# Patient Record
Sex: Female | Born: 1955 | Race: Asian | Hispanic: No | Marital: Married | Smoking: Never smoker
Health system: Southern US, Community
[De-identification: ages and names within clinical notes are randomized; demographics above are authoritative.]

## PROBLEM LIST (undated history)

## (undated) DIAGNOSIS — M1990 Unspecified osteoarthritis, unspecified site: Secondary | ICD-10-CM

## (undated) DIAGNOSIS — E785 Hyperlipidemia, unspecified: Secondary | ICD-10-CM

## (undated) DIAGNOSIS — R42 Dizziness and giddiness: Secondary | ICD-10-CM

## (undated) HISTORY — DX: Hyperlipidemia, unspecified: E78.5

## (undated) HISTORY — DX: Dizziness and giddiness: R42

## (undated) HISTORY — DX: Unspecified osteoarthritis, unspecified site: M19.90

---

## 2014-09-12 ENCOUNTER — Ambulatory Visit: Payer: Self-pay | Attending: Family Medicine

## 2014-12-31 ENCOUNTER — Emergency Department (INDEPENDENT_AMBULATORY_CARE_PROVIDER_SITE_OTHER): Payer: Self-pay

## 2014-12-31 ENCOUNTER — Emergency Department (INDEPENDENT_AMBULATORY_CARE_PROVIDER_SITE_OTHER)
Admission: EM | Admit: 2014-12-31 | Discharge: 2014-12-31 | Disposition: A | Discharge status: Home / Self Care | Payer: Self-pay | Source: Home / Self Care | Attending: Family Medicine | Admitting: Family Medicine

## 2014-12-31 ENCOUNTER — Encounter: Payer: Self-pay | Admitting: Emergency Medicine

## 2014-12-31 DIAGNOSIS — M79645 Pain in left finger(s): Secondary | ICD-10-CM

## 2014-12-31 DIAGNOSIS — M6530 Trigger finger, unspecified finger: Secondary | ICD-10-CM

## 2014-12-31 DIAGNOSIS — M654 Radial styloid tenosynovitis [de Quervain]: Secondary | ICD-10-CM

## 2014-12-31 MED ORDER — DICLOFENAC SODIUM 1 % TD GEL
1.0000 | Freq: Three times a day (TID) | TRANSDERMAL | Status: DC
Start: 2014-12-31 — End: 2015-02-16

## 2014-12-31 NOTE — ED Notes (Signed)
Left thumb pain x 2 weeks worse yesterday 8/10

## 2014-12-31 NOTE — ED Provider Notes (Signed)
CSN: [id number]     Arrival date & time 12/31/14  1315 History   First MD Initiated Contact with Patient 12/31/14 1327     Chief Complaint  Patient presents with  . Hand Pain      HPI Comments: Patient is [place], and her son is present as Engineer, technical sales. She complains of pain in her left thumb for two weeks.  She is left-handed and is having difficulty with her house work.  She denies injury.  She complains that her left thumb IP joint is stiff, painful, and clicks with movement.  Patient is a 59 y.o. female presenting with hand pain. The history is provided by the patient and a relative. The history is limited by a language barrier.  Hand Pain This is a new problem. Episode onset: 2 weeks ago. The problem occurs constantly. The problem has been gradually worsening. Exacerbated by: movement of left thumb. Nothing relieves the symptoms. She has tried nothing for the symptoms.    History reviewed. No pertinent past medical history. History reviewed. No pertinent past surgical history. No family history on file. Social History  Substance Use Topics  . Smoking status: Never Smoker   . Smokeless tobacco: None  . Alcohol Use: No   OB History    No data available     Review of Systems  All other systems reviewed and are negative.   Allergies  Review of patient's allergies indicates no known allergies.  Home Medications   Prior to Admission medications   Medication Sig Start Date End Date Taking? Authorizing Provider  ibuprofen (ADVIL,MOTRIN) 800 MG tablet Take 800 mg by mouth every 8 (eight) hours as needed.   Yes Historical Provider, MD  diclofenac sodium (VOLTAREN) 1 % GEL Apply 1 application topically 3 (three) times daily. Apply 1 or 2 gm each application 12/31/14   Lattie Haw, MD   BP 144/78 mmHg  Pulse 56  Temp(Src) 98.6 F (37 C) (Oral)  Ht  (1.549 m)  Wt 130 lb (58.968 kg)  BMI 24.58 kg/m2  SpO2 99% Physical Exam  Constitutional: She appears well-developed  and well-nourished.  HENT:  Head: Normocephalic.  Eyes: Pupils are equal, round, and reactive to light.  Pulmonary/Chest: No respiratory distress.  Musculoskeletal:       Left hand: She exhibits decreased range of motion, tenderness and bony tenderness. She exhibits no deformity and no swelling.       Hands: Left thumb has decreased range of motion at the IP joint.  There is tenderness to palpation over the IP joint, and also the thumb extensor tendons, although the MCP joint has good range of motion.  Patient has difficulty actively flexing her left thumb IP joint, and it returns to neutral position with a painful click.  Neurological: She is alert.  Skin: Skin is warm and dry.  Nursing note and vitals reviewed.   ED Course  Procedures  none  Imaging Review Dg Finger Thumb Left  12/31/2014   CLINICAL DATA:  59 year old female with a history of left thumb pain for 2 weeks. No history of injury.  EXAM: LEFT THUMB 2+V  COMPARISON:  None.  FINDINGS: No displaced fracture. No radiopaque foreign body. No significant soft tissue swelling. Degenerative changes of the interphalangeal joint, and of the first metacarpal phalangeal joint.  IMPRESSION: Negative for acute bony abnormality. Degenerative changes of the interphalangeal joint and first metacarpal phalangeal joint.  Signed,  Yvone Neu. Loreta Ave DO  [facility]  [facility]   Electronically Signed   By: Gilmer Mor D.O.   On: 12/31/2014 14:04     MDM   1. Trigger finger, acquired   2. De Quervain's tenosynovitis, left    Begin Voltaren gel.  Dispensed thumb spica splint. Wear wrist brace for about 10 to 14 days.  Apply ice pack for 10 to 15 minutes, 3 to 4 times daily  Continue until pain decreases.  Begin thumb range of motion and stretching exercises. Followup with Dr. Rodney Langton or Dr. Clementeen [facility] ([facility]) if not improving about two to three weeks.     Lattie Haw, MD 01/01/15 2002

## 2014-12-31 NOTE — Discharge Instructions (Signed)
Wear wrist brace for about 10 to 14 days.  Apply ice pack for 10 to 15 minutes, 3 to 4 times daily  Continue until pain decreases.  Begin thumb range of motion and stretching exercises.   Trigger Finger Trigger finger (digital tendinitis and stenosing tenosynovitis) is a common disorder that causes an often painful catching of the fingers or thumb. It occurs as a clicking, snapping, or locking of a finger in the palm of the hand. This is caused by a problem with the tendons that flex or bend the fingers sliding smoothly through their sheaths. The condition may occur in any finger or a couple fingers at the same time.  The finger may lock with the finger curled or suddenly straighten out with a snap. This is more common in patients with rheumatoid arthritis and diabetes. Left untreated, the condition may get worse to the point where the finger becomes locked in flexion, like making a fist, or less commonly locked with the finger straightened out. CAUSES   Inflammation and scarring that lead to swelling around the tendon sheath.  Repeated or forceful movements.  Rheumatoid arthritis, an autoimmune disease that affects joints.  Gout.  Diabetes mellitus. SIGNS AND SYMPTOMS  Soreness and swelling of your finger.  A painful clicking or snapping as you bend and straighten your finger. DIAGNOSIS  Your health care provider will do a physical exam of your finger to diagnose trigger finger. TREATMENT   Splinting for 6-8 weeks may be helpful.  Nonsteroidal anti-inflammatory medicines (NSAIDs) can help to relieve the pain and inflammation.  Cortisone injections, along with splinting, may speed up recovery. Several injections may be required. Cortisone may give relief after one injection.  Surgery is another treatment that may be used if conservative treatments do not work. Surgery can be minor, without incisions (a cut does not have to be made), and can be done with a needle through the  skin.  Other surgical choices involve an open procedure in which the surgeon opens the hand through a small incision and cuts the pulley so the tendon can again slide smoothly. Your hand will still work fine. HOME CARE INSTRUCTIONS  Apply ice to the injured area, twice per day:  Put ice in a plastic bag.  Place a towel between your skin and the bag.  Leave the ice on for 20 minutes, 3-4 times a day.  Rest your hand often. MAKE SURE YOU:   Understand these instructions.  Will watch your condition.  Will get help right away if you are not doing well or get worse. Document Released: 02/17/2004 Document Revised: 12/30/2012 Document Reviewed: 09/29/2012 [facility] Patient Information 2015 [place], [place]. This information is not intended to replace advice given to you by your health care provider. Make sure you discuss any questions you have with your health care provider.   De Quervain's Disease Erin Nielsen disease is a condition often seen in racquet sports where there is a soreness (inflammation) in the cord like structures (tendons) which attach muscle to bone on the thumb side of the wrist. There may be a tightening of the tissuesaround the tendons. This condition is often helped by giving up or modifying the activity which caused it. When conservative treatment does not help, surgery may be required. Conservative treatment could include changes in the activity which brought about the problem or made it worse. Anti-inflammatory medications and injections may be used to help decrease the inflammation and help with pain control. Your caregiver will help you  determine which is best for you. DIAGNOSIS  Often the diagnosis (learning what is wrong) can be made by examination. Sometimes x-rays are required. HOME CARE INSTRUCTIONS   Apply ice to the sore area for 15-20 minutes, 03-04 times per day while awake. Put the ice in a plastic bag and place a towel between the bag of ice and your skin.  This is especially helpful if it can be done after all activities involving the sore wrist.  Temporary splinting may help.  Only take over-the-counter or prescription medicines for pain, discomfort or fever as directed by your caregiver. SEEK MEDICAL CARE IF:   Pain relief is not obtained with medications, or if you have increasing pain and seem to be getting worse rather than better. MAKE SURE YOU:   Understand these instructions.  Will watch your condition.  Will get help right away if you are not doing well or get worse. Document Released: 01/22/2001 Document Revised: 07/22/2011 Document Reviewed: 09/01/2013 [facility] Patient Information 2015 [place], [place]. This information is not intended to replace advice given to you by your health care provider. Make sure you discuss any questions you have with your health care provider.

## 2015-01-09 ENCOUNTER — Encounter: Payer: Self-pay | Admitting: Family Medicine

## 2015-01-09 ENCOUNTER — Ambulatory Visit (INDEPENDENT_AMBULATORY_CARE_PROVIDER_SITE_OTHER): Payer: No Typology Code available for payment source | Admitting: Family Medicine

## 2015-01-09 ENCOUNTER — Other Ambulatory Visit: Payer: Self-pay | Admitting: Family Medicine

## 2015-01-09 VITALS — BP 126/70 | HR 52 | Temp 98.2°F | Resp 18 | Ht 62.0 in | Wt 128.0 lb

## 2015-01-09 DIAGNOSIS — Z0000 Encounter for general adult medical examination without abnormal findings: Secondary | ICD-10-CM | POA: Insufficient documentation

## 2015-01-09 DIAGNOSIS — Z7189 Other specified counseling: Secondary | ICD-10-CM

## 2015-01-09 DIAGNOSIS — H8110 Benign paroxysmal vertigo, unspecified ear: Secondary | ICD-10-CM | POA: Insufficient documentation

## 2015-01-09 DIAGNOSIS — Z7689 Persons encountering health services in other specified circumstances: Secondary | ICD-10-CM

## 2015-01-09 LAB — CBC WITH DIFFERENTIAL/PLATELET
BASOS ABS: 0.1 10*3/uL (ref 0.0–0.1)
Basophils Relative: 1 % (ref 0–1)
EOS ABS: 0.7 10*3/uL (ref 0.0–0.7)
Eosinophils Relative: 11 % — ABNORMAL HIGH (ref 0–5)
HCT: 38.8 % (ref 36.0–46.0)
HEMOGLOBIN: 12.5 g/dL (ref 12.0–15.0)
LYMPHS ABS: 3.1 10*3/uL (ref 0.7–4.0)
LYMPHS PCT: 51 % — AB (ref 12–46)
MCH: 28.9 pg (ref 26.0–34.0)
MCHC: 32.2 g/dL (ref 30.0–36.0)
MCV: 89.8 fL (ref 78.0–100.0)
MPV: 8.2 fL — ABNORMAL LOW (ref 8.6–12.4)
Monocytes Absolute: 0.4 10*3/uL (ref 0.1–1.0)
Monocytes Relative: 6 % (ref 3–12)
NEUTROS ABS: 1.9 10*3/uL (ref 1.7–7.7)
Neutrophils Relative %: 31 % — ABNORMAL LOW (ref 43–77)
PLATELETS: 261 10*3/uL (ref 150–400)
RBC: 4.32 MIL/uL (ref 3.87–5.11)
RDW: 13.1 % (ref 11.5–15.5)
WBC: 6.1 10*3/uL (ref 4.0–10.5)

## 2015-01-09 MED ORDER — MECLIZINE HCL 50 MG PO TABS: 25.0000 mg | ORAL_TABLET | Freq: Three times a day (TID) | ORAL | Status: DC | PRN

## 2015-01-09 MED ORDER — OXYBUTYNIN CHLORIDE ER 10 MG PO TB24: 10.0000 mg | ORAL_TABLET | Freq: Every day | ORAL | Status: DC

## 2015-01-09 NOTE — Patient Instructions (Addendum)
I am prescribing a medication for dizziness and for urinary frequency to [facility] Come back in one month for PAP smear and breast exam.

## 2015-01-09 NOTE — Progress Notes (Signed)
Patient ID: Erin Nielsen, female   DOB: [birth year], 59 y.o.   MRN: [id number]   Erin Nielsen, is a 59 y.o. female  WUJ:[id number]  [id number]:[id number]  DOB - 24-Jul-1955  CC:  Chief Complaint  Patient presents with  . establish new patient    dizziness with nausea, labs, stomach ache at night       HPI: Erin Nielsen is a 59 y.o. female here to establish care. Her medical history is positive for arthritis of shoulders and knees. She takes naproxen for this. She has a history of hypertension, Vertigo and occassional  Sharp abdominal pain. She has used a [place] prescription for the vertigo but I am unsure that it is. I suspect something like meclizine. She takes OTC prilosec occassional for epigastric discomfort. Information is obtain with the use of an interpreter.  She denies every having a PAP smear or a mammogram. She does also complate of urinary frequency sometimes 4-5 times a night. She would like something to help with that.   No Known Allergies Past Medical History  Diagnosis Date  . Arthritis   . Hyperlipidemia    Current Outpatient Prescriptions on File Prior to Visit  Medication Sig Dispense Refill  . diclofenac sodium (VOLTAREN) 1 % GEL Apply 1 application topically 3 (three) times daily. Apply 1 or 2 gm each application 100 g 1  . ibuprofen (ADVIL,MOTRIN) 800 MG tablet Take 800 mg by mouth every 8 (eight) hours as needed.     No current facility-administered medications on file prior to visit.   History reviewed. No pertinent family history. Social History   Social History  . Marital Status: Married    Spouse Name: N/A  . Number of Children: N/A  . Years of Education: N/A   Occupational History  . Not on file.   Social History Main Topics  . Smoking status: Never Smoker   . Smokeless tobacco: Never Used  . Alcohol Use: No  . Drug Use: Not on file  . Sexual Activity: Not on file   Other Topics Concern  . Not on file   Social History Narrative    Review of  Systems: Constitutional: Negative for fever, chills, appetite change, weight loss,  Fatigue. Skin: Negative for rashes or lesions of concern. There is a bruise on her left shoulder from Chinese Cupping HENT: Negative for ear pain, ear discharge.nose bleeds. Positive for intermittent ear noises. Eyes: Negative for pain, discharge, redness, itching and visual disturbance. Neck: Negative for pain, stiffness Respiratory: Negative for cough, shortness of breath,   Cardiovascular: Negative for chest pain, palpitations and leg swelling. Gastrointestinal: Negative for nausea, vomiting, diarrhea, constipations. Positive for occassional sharp upper abd pain.  Genitourinary: Negative for dysuria, urgency, hematuria. Positive for frequency Musculoskeletal: Negative for joint  swelling, and gait problem.Negative for weakness.Positive for back and knee pain. Neurological: Negative for tremors, seizures, syncope,   light-headedness, numbness and headaches. Positive for dizziness that is described as room spinning with nausea. About every 2-3 months. Hematological: Negative for easy bruising or bleeding Psychiatric/Behavioral: Negative for depression, anxiety, decreased concentration, confusion   Objective:   Filed Vitals:   01/09/15 0929  BP: 126/70  Pulse: 52  Temp: 98.2 F (36.8 C)  Resp: 18    Physical Exam: Constitutional: Patient appears well-developed and well-nourished. No distress. HENT: Normocephalic, atraumatic, External right and left ear normal. Oropharynx is clear and moist.  Eyes: Conjunctivae and EOM are normal. PERRLA, no scleral icterus. Neck: Normal ROM. Neck supple. No lymphadenopathy, No  thyromegaly. CVS: RRR, S1/S2 +, no murmurs, no gallops, no rubs Pulmonary: Effort and breath sounds normal, no stridor, rhonchi, wheezes, rales.  Abdominal: Soft. Normoactive BS,, no distension, tenderness, rebound or guarding.  Musculoskeletal: Normal range of motion. No edema and no  tenderness.  Neuro: Alert.Normal muscle tone coordination. Non-focal Skin: Skin is warm and dry. No rash noted. Not diaphoretic. No erythema. No pallor. There is a round bruise on her left shoulder from Chinese cupping. Psychiatric: Normal mood and affect. Behavior, judgment, thought content normal.  No results found for: WBC, HGB, HCT, MCV, PLT No results found for: CREATININE, BUN, NA, K, CL, CO2  No results found for: HGBA1C Lipid Panel  No results found for: CHOL, TRIG, HDL, CHOLHDL, VLDL, LDLCALC     Assessment and plan:   1. Encounter to establish care  - COMPLETE METABOLIC PANEL WITH GFR - CBC with Differential - Lipid panel   No Follow-up on file.  The patient was given clear instructions to go to ER or return to medical center if symptoms don't improve, worsen or new problems develop. The patient verbalized understanding.    AVIA MERKLEY FNP  01/09/2015, 12:23 PM

## 2015-01-10 LAB — LIPID PANEL
CHOLESTEROL: 191 mg/dL (ref 125–200)
HDL: 51 mg/dL (ref >=46)
LDL Cholesterol: 117 mg/dL (ref <130)
Total CHOL/HDL Ratio: 3.7 Ratio (ref <=5.0)
Triglycerides: 115 mg/dL (ref <150)
VLDL: 23 mg/dL (ref <30)

## 2015-01-10 LAB — COMPREHENSIVE METABOLIC PANEL
ALK PHOS: 42 U/L (ref 33–130)
ALT: 25 U/L (ref 6–29)
AST: 28 U/L (ref 10–35)
Albumin: 4.3 g/dL (ref 3.6–5.1)
BUN: 10 mg/dL (ref 7–25)
CO2: 24 mmol/L (ref 20–31)
CREATININE: 0.67 mg/dL (ref 0.50–1.05)
Calcium: 8.8 mg/dL (ref 8.6–10.4)
Chloride: 104 mmol/L (ref 98–110)
Glucose, Bld: 85 mg/dL (ref 65–99)
POTASSIUM: 4.1 mmol/L (ref 3.5–5.3)
Sodium: 142 mmol/L (ref 135–146)
TOTAL PROTEIN: 7 g/dL (ref 6.1–8.1)
Total Bilirubin: 0.8 mg/dL (ref 0.2–1.2)

## 2015-01-12 ENCOUNTER — Ambulatory Visit (INDEPENDENT_AMBULATORY_CARE_PROVIDER_SITE_OTHER): Payer: No Typology Code available for payment source

## 2015-01-12 ENCOUNTER — Encounter: Payer: Self-pay | Admitting: Family Medicine

## 2015-01-12 ENCOUNTER — Ambulatory Visit (INDEPENDENT_AMBULATORY_CARE_PROVIDER_SITE_OTHER): Payer: No Typology Code available for payment source | Admitting: Family Medicine

## 2015-01-12 VITALS — BP 143/78 | HR 59 | Wt 132.0 lb

## 2015-01-12 DIAGNOSIS — M25532 Pain in left wrist: Secondary | ICD-10-CM

## 2015-01-12 DIAGNOSIS — M65312 Trigger thumb, left thumb: Secondary | ICD-10-CM

## 2015-01-12 NOTE — Progress Notes (Signed)
Subjective:    I'm seeing this patient as a consultation for:  Dr. Cathren Harsh  CC: Left trigger thumb.   HPI: Patient notes a two-week history of left thumb pain. She notes triggering of the thumb at the interphalangeal joint and pain at the MCP. She denies any injury. Radiating pain weakness or numbness. No fevers or chills. She was seen in urgent care on August 20 were x-rays of her thumb showed degenerative changes of the IP and MCP. She was treated with a thumb spica splint and diclofenac gel. She was asked to follow-up with myself if not better for consideration of trigger thumb injection.  Additionally patient notes pain at the ulnar aspect of her left wrist. She denies any injury. Symptoms present for a few weeks. No radiating pain weakness or numbness. Pain is worse with wrist motion. The thumb brace has been only mildly helpful. Additionally she's tried using some diclofenac gel which has helped a little.   Additionally she notes some mild left shoulder pain. She would like to defer this issue to let her day.  Past medical history, Surgical history, Family history not pertinant except as noted below, Social history, Allergies, and medications have been entered into the medical record, reviewed, and no changes needed.   Review of Systems: No headache, visual changes, nausea, vomiting, diarrhea, constipation, dizziness, abdominal pain, skin rash, fevers, chills, night sweats, weight loss, swollen lymph nodes, body aches, joint swelling, muscle aches, chest pain, shortness of breath, mood changes, visual or auditory hallucinations.   Objective:    Filed Vitals:   01/12/15 1617  BP: 143/78  Pulse: 59   General: Well Developed, well nourished, and in no acute distress.  Neuro/Psych: Alert and oriented x3, extra-ocular muscles intact, able to move all 4 extremities, sensation grossly intact. Skin: Warm and dry, no rashes noted.  Respiratory: Not using accessory muscles, speaking in full  sentences, trachea midline.  Cardiovascular: Pulses palpable, no extremity edema. Abdomen: Does not appear distended. MSK: Left thumb normal-appearing. Tender to palpation volar MCP. Triggering present with motion. Strength capillary refill pulses and sensation are intact to the hand and wrist. The ulnar wrist is normal appearing and mildly tender. Normal motion of clicking palpated. Strength is intact.  Procedure: Real-time Ultrasound Guided Injection of left trigger thumb  Device: GE Logiq E  Images permanently stored and available for review in the ultrasound unit. Verbal informed consent obtained. Discussed risks and benefits of procedure. Warned about infection bleeding damage to structures skin hypopigmentation and fat atrophy among others. Patient expresses understanding and agreement Time-out conducted.  Noted no overlying erythema, induration, or other signs of local infection.  Skin prepped in a sterile fashion.  Local anesthesia: Topical Ethyl chloride.  With sterile technique and under real time ultrasound guidance: 40 mg of Kenalog and 2 mL of lidocaine injected easily into the tendon sheath.  Completed without difficulty  Pain immediately resolved suggesting accurate placement of the medication.  Advised to call if fevers/chills, erythema, induration, drainage, or persistent bleeding.  Images permanently stored and available for review in the ultrasound unit.  Impression: Technically successful ultrasound guided injection.     Left wrist x-ray is pending however my preliminary reading shows a large gap between the distal end of the ulnar and the triquetrum.  There are some degenerative changes throughout the wrist. No acute bony abnormalities.  No results found for this or any previous visit (from the past 24 hour(s)). No results found.  Impression and Recommendations:  This case required medical decision making of moderate complexity.   M[place]arin  interpreter use for today's visit.

## 2015-01-12 NOTE — Patient Instructions (Signed)
Thank you for coming in today. Return in 1 week.  Call or go to the ER if you develop a large red swollen joint with extreme pain or oozing puss.   Trigger Finger Trigger finger (digital tendinitis and stenosing tenosynovitis) is a common disorder that causes an often painful catching of the fingers or thumb. It occurs as a clicking, snapping, or locking of a finger in the palm of the hand. This is caused by a problem with the tendons that flex or bend the fingers sliding smoothly through their sheaths. The condition may occur in any finger or a couple fingers at the same time.  The finger may lock with the finger curled or suddenly straighten out with a snap. This is more common in patients with rheumatoid arthritis and diabetes. Left untreated, the condition may get worse to the point where the finger becomes locked in flexion, like making a fist, or less commonly locked with the finger straightened out. CAUSES   Inflammation and scarring that lead to swelling around the tendon sheath.  Repeated or forceful movements.  Rheumatoid arthritis, an autoimmune disease that affects joints.  Gout.  Diabetes mellitus. SIGNS AND SYMPTOMS  Soreness and swelling of your finger.  A painful clicking or snapping as you bend and straighten your finger. DIAGNOSIS  Your health care provider will do a physical exam of your finger to diagnose trigger finger. TREATMENT   Splinting for 6-8 weeks may be helpful.  Nonsteroidal anti-inflammatory medicines (NSAIDs) can help to relieve the pain and inflammation.  Cortisone injections, along with splinting, may speed up recovery. Several injections may be required. Cortisone may give relief after one injection.  Surgery is another treatment that may be used if conservative treatments do not work. Surgery can be minor, without incisions (a cut does not have to be made), and can be done with a needle through the skin.  Other surgical choices involve an open  procedure in which the surgeon opens the hand through a small incision and cuts the pulley so the tendon can again slide smoothly. Your hand will still work fine. HOME CARE INSTRUCTIONS  Apply ice to the injured area, twice per day:  Put ice in a plastic bag.  Place a towel between your skin and the bag.  Leave the ice on for 20 minutes, 3-4 times a day.  Rest your hand often. MAKE SURE YOU:   Understand these instructions.  Will watch your condition.  Will get help right away if you are not doing well or get worse. Document Released: 02/17/2004 Document Revised: 12/30/2012 Document Reviewed: 09/29/2012 [facility] Patient Information 2015 [place], [place]. This information is not intended to replace advice given to you by your health care provider. Make sure you discuss any questions you have with your health care provider.

## 2015-01-13 NOTE — Assessment & Plan Note (Signed)
Ulnar-sided wrist pain. At this time the etiology is somewhat unclear. Awaiting formal x-ray reading. I suspect patient may have some tendinitis. Additionally she has pain in the area of the TFCC.  Plan for watchful waiting. If not improved with little bit of rest will proceed with further evaluation.

## 2015-01-13 NOTE — Assessment & Plan Note (Signed)
Status post injection today. Plan for fabric splint across the IP joint to limit triggering. Return in one to 2 weeks for recheck and reevaluation.

## 2015-01-18 ENCOUNTER — Telehealth: Payer: Self-pay

## 2015-01-18 NOTE — Telephone Encounter (Signed)
[phone] PATIENT RECEIVED LETTER. PLEASE CALL

## 2015-01-20 NOTE — Telephone Encounter (Signed)
I called and spoke to the pt's daughter, JUN. Pt has never had a colonoscopy. Jun said that the pt has stomach pain at night some. No nausea and no vomiting.  I have scheduled her for an OV with Tana Coast, PA on 02/08/2015 at 2:30 PM. It is documented in her chart that she has [facility] @ 100% 10-06-2014-04-08-2015.

## 2015-01-26 ENCOUNTER — Ambulatory Visit (HOSPITAL_COMMUNITY)
Admission: RE | Admit: 2015-01-26 | Discharge: 2015-01-26 | Disposition: A | Discharge status: Home / Self Care | Payer: No Typology Code available for payment source | Source: Ambulatory Visit | Attending: Family Medicine | Admitting: Family Medicine

## 2015-01-26 DIAGNOSIS — Z1231 Encounter for screening mammogram for malignant neoplasm of breast: Secondary | ICD-10-CM | POA: Insufficient documentation

## 2015-01-26 DIAGNOSIS — Z0000 Encounter for general adult medical examination without abnormal findings: Secondary | ICD-10-CM

## 2015-02-08 ENCOUNTER — Ambulatory Visit: Payer: No Typology Code available for payment source | Admitting: Gastroenterology

## 2015-02-09 ENCOUNTER — Ambulatory Visit: Payer: No Typology Code available for payment source | Admitting: Family Medicine

## 2015-02-13 ENCOUNTER — Encounter: Payer: Self-pay | Admitting: Family Medicine

## 2015-02-13 ENCOUNTER — Ambulatory Visit (INDEPENDENT_AMBULATORY_CARE_PROVIDER_SITE_OTHER): Payer: No Typology Code available for payment source | Admitting: Family Medicine

## 2015-02-13 ENCOUNTER — Other Ambulatory Visit: Payer: Self-pay | Admitting: Family Medicine

## 2015-02-13 VITALS — BP 125/54 | HR 61 | Temp 98.0°F | Resp 16 | Ht 62.0 in | Wt 128.0 lb

## 2015-02-13 DIAGNOSIS — Z23 Encounter for immunization: Secondary | ICD-10-CM

## 2015-02-13 DIAGNOSIS — R350 Frequency of micturition: Secondary | ICD-10-CM

## 2015-02-13 DIAGNOSIS — Z0000 Encounter for general adult medical examination without abnormal findings: Secondary | ICD-10-CM

## 2015-02-13 NOTE — Patient Instructions (Signed)
Will let you know if there is anything abnormal in bloodwork done today Let Dr. Logan Bores know that you need to transfer care there, since we are both primary care sites. May increase oxybutrin to twice a day and see if this helps.

## 2015-02-13 NOTE — Progress Notes (Signed)
Patient ID: Erin Nielsen, female   DOB: December 11, 1955, 59 y.o.   MRN: [id number]   Erin Nielsen, is a 59 y.o. female  [id number]  [id number]  DOB - 05-Dec-1955  CC:  Chief Complaint  Patient presents with  . Follow-up       HPI: Erin Nielsen is a 59 y.o. female here for follow-up. Patient was seen here about a month ago to establish care and is following up to address health maintenance items. I started her on oxybutrin as HS for urinary frequency. She took only for five nights and did not really help and she thought it made her dizzy. I discover today that she has a appointment on the 6th with another [facility] location. She has been seen there once already for her trigger finger and has received an injection. I have explained that as this is a primary care site, she will need to be seen either here or there but not both. She will transfer her care to Dr. Earma Reading. Today will address needed health care needs. She is in need of Hep C screening, a Tdap, and a flu shot. We have already referred her to GI for colonoscopy. Will defer the PAP to Dr. Logan Bores office.  No Known Allergies Past Medical History  Diagnosis Date  . Arthritis   . Hyperlipidemia    Current Outpatient Prescriptions on File Prior to Visit  Medication Sig Dispense Refill  . diclofenac sodium (VOLTAREN) 1 % GEL Apply 1 application topically 3 (three) times daily. Apply 1 or 2 gm each application 100 g 1  . ibuprofen (ADVIL,MOTRIN) 800 MG tablet Take 800 mg by mouth every 8 (eight) hours as needed.    . meclizine (ANTIVERT) 50 MG tablet Take 0.5 tablets (25 mg total) by mouth 3 (three) times daily as needed for dizziness or nausea. 30 tablet 1  . Omega-3 Fatty Acids (FISH OIL PO) Take by mouth.    Marland Kitchen omeprazole (PRILOSEC) 10 MG capsule Take 10 mg by mouth daily.    . Ginkgo Biloba Extract 60 MG CAPS Take by mouth.    . oxybutynin (DITROPAN XL) 10 MG 24 hr tablet Take 1 tablet (10 mg total) by mouth at bedtime.  (Patient not taking: Reported on 02/13/2015) 30 tablet 11   No current facility-administered medications on file prior to visit.   No family history on file. Social History   Social History  . Marital Status: Married    Spouse Name: N/A  . Number of Children: N/A  . Years of Education: N/A   Occupational History  . Not on file.   Social History Main Topics  . Smoking status: Never Smoker   . Smokeless tobacco: Never Used  . Alcohol Use: No  . Drug Use: Not on file  . Sexual Activity: Not on file   Other Topics Concern  . Not on file   Social History Narrative    Review of Systems: Constitutional: Negative  HENT: Negative except ringing in ears.  Eyes: Negative  Neck: Negative  Respiratory: Negative  Cardiovascular: Negative  Gastrointestinal: Negative  Genitourinary: Positive for frequency Musculoskeletal: Negative except for pain in left thumb.  Neurological: Negative  Except for occassional dizziness. Hematological: Negative  Psychiatric/Behavioral: Negative     Objective:   Filed Vitals:   02/13/15 0829  BP: 125/54  Pulse: 61  Temp: 98 F (36.7 C)  Resp: 16    Physical Exam: Constitutional: Patient appears well-developed and well-nourished. No distress. HENT: Normocephalic, atraumatic,  External right and left ear normal. Oropharynx is clear and moist.  Eyes: Conjunctivae and EOM are normal. PERRLA, no scleral icterus. Neck: Normal ROM. Neck supple. No lymphadenopathy, No thyromegaly. CVS: RRR, S1/S2 +, no murmurs, no gallops, no rubs Pulmonary: Effort and breath sounds normal, no stridor, rhonchi, wheezes, rales.  Abdominal: Soft. Normoactive BS,, no distension, tenderness, rebound or guarding.  Musculoskeletal: Normal range of motion. No edema and no tenderness.  Neuro: Alert.Normal muscle tone coordination. Non-focal Skin: Skin is warm and dry. No rash noted. Not diaphoretic. No erythema. No pallor. Psychiatric: Normal mood and affect. Behavior,  judgment, thought content normal.  No results found for: WBC, HGB, HCT, MCV, PLT No results found for: CREATININE, BUN, NA, K, CL, CO2  No results found for: HGBA1C Lipid Panel  No results found for: CHOL, TRIG, HDL, CHOLHDL, VLDL, LDLCALC     Assessment and plan:   1. Health care maintenance  - CBC with Differential -Hepatitis C antibody. -Have discussed labs drawn at last visit with patient and son. There were some abnormalities in CBC, so will repeat.  2. Frequency of urination -May increase oxybutrin to twice a day if desired.   3. Need for prophylactic vaccination and inoculation against influenza - Flu Vaccine QUAD 36+ mos PF IM (Fluarix & Fluzone Quad PF)  4. Need for prophylactic vaccination with combined diphtheria-tetanus-pertussis (DTP) vaccine  -Tdap    Follow-up: None here is transferring care.  Patient has appointment with Dr. Earma Reading at another [facility] location for follow-up regarding finger. I have explained they will be to transfer care there if she is to continue to see him.  The patient was given clear instructions to go to ER or return to medical center if symptoms don't improve, worsen or new problems develop. The patient verbalized understanding.      Henrietta Hoover, MSN, FNP-BC   02/13/2015, 9:18 AM

## 2015-02-14 LAB — CBC WITH DIFFERENTIAL/PLATELET
BASOS PCT: 1 % (ref 0–1)
Basophils Absolute: 0.1 10*3/uL (ref 0.0–0.1)
EOS ABS: 0.6 10*3/uL (ref 0.0–0.7)
EOS PCT: 9 % — AB (ref 0–5)
HCT: 39.8 % (ref 36.0–46.0)
Hemoglobin: 12.7 g/dL (ref 12.0–15.0)
Lymphocytes Relative: 42 % (ref 12–46)
Lymphs Abs: 2.7 10*3/uL (ref 0.7–4.0)
MCH: 28.9 pg (ref 26.0–34.0)
MCHC: 31.9 g/dL (ref 30.0–36.0)
MCV: 90.7 fL (ref 78.0–100.0)
MONO ABS: 0.3 10*3/uL (ref 0.1–1.0)
MPV: 8.2 fL — ABNORMAL LOW (ref 8.6–12.4)
Monocytes Relative: 5 % (ref 3–12)
NEUTROS ABS: 2.8 10*3/uL (ref 1.7–7.7)
Neutrophils Relative %: 43 % (ref 43–77)
PLATELETS: 245 10*3/uL (ref 150–400)
RBC: 4.39 MIL/uL (ref 3.87–5.11)
RDW: 13.3 % (ref 11.5–15.5)
WBC: 6.5 10*3/uL (ref 4.0–10.5)

## 2015-02-14 LAB — HEPATITIS C ANTIBODY: HCV AB: NEGATIVE

## 2015-02-16 ENCOUNTER — Encounter: Payer: Self-pay | Admitting: Family Medicine

## 2015-02-16 ENCOUNTER — Ambulatory Visit (INDEPENDENT_AMBULATORY_CARE_PROVIDER_SITE_OTHER): Payer: No Typology Code available for payment source | Admitting: Family Medicine

## 2015-02-16 VITALS — BP 128/71 | HR 52 | Wt 131.0 lb

## 2015-02-16 DIAGNOSIS — M25532 Pain in left wrist: Secondary | ICD-10-CM

## 2015-02-16 DIAGNOSIS — M65312 Trigger thumb, left thumb: Secondary | ICD-10-CM

## 2015-02-16 MED ORDER — MELOXICAM 15 MG PO TABS: 15.0000 mg | ORAL_TABLET | Freq: Every day | ORAL | Status: DC

## 2015-02-16 MED ORDER — DICLOFENAC SODIUM 1 % TD GEL: 2.0000 g | Freq: Three times a day (TID) | TRANSDERMAL | Status: DC

## 2015-02-16 NOTE — Patient Instructions (Signed)
Thank you for coming in today. Return in 3 weeks.  

## 2015-02-17 NOTE — Progress Notes (Signed)
Erin Nielsen is a 59 y.o. female who presents to Urgent Care today for a low of hand pain. Patient was seen last month for hand pain and trigger thumb. She received a injection for trigger thumb as well as splinting and feels a lot better. She notes continued on her wrist pain and pain in the MCP and interphalangeal joint of the thumb. She denies any radiating pain weakness or numbness fevers or chills. She has used some diclofenac gel which helped a lot. She would like a refill that if possible. She is confused about who her primary care provider is. She was seen initially at the [facility] sickle cell clinic. She was referred to me for sports medicine specialty-related care.    Past Medical History  Diagnosis Date  . Arthritis   . Hyperlipidemia    No past surgical history on file. Social History  Substance Use Topics  . Smoking status: Never Smoker   . Smokeless tobacco: Never Used  . Alcohol Use: No   ROS as above Medications: Current Outpatient Prescriptions  Medication Sig Dispense Refill  . diclofenac sodium (VOLTAREN) 1 % GEL Apply 2 g topically 3 (three) times daily. Apply 1 or 2 gm each application 100 g 12  . Ginkgo Biloba Extract 60 MG CAPS Take by mouth.    Marland Kitchen ibuprofen (ADVIL,MOTRIN) 800 MG tablet Take 800 mg by mouth every 8 (eight) hours as needed.    . meclizine (ANTIVERT) 50 MG tablet Take 0.5 tablets (25 mg total) by mouth 3 (three) times daily as needed for dizziness or nausea. 30 tablet 1  . meloxicam (MOBIC) 15 MG tablet Take 1 tablet (15 mg total) by mouth daily. 30 tablet 1  . Omega-3 Fatty Acids (FISH OIL PO) Take by mouth.    Marland Kitchen omeprazole (PRILOSEC) 10 MG capsule Take 10 mg by mouth daily.    Marland Kitchen oxybutynin (DITROPAN XL) 10 MG 24 hr tablet Take 1 tablet (10 mg total) by mouth at bedtime. 30 tablet 11   No current facility-administered medications for this visit.   No Known Allergies   Exam:  BP 128/71 mmHg  Pulse 52  Wt 131 lb (59.421 kg) Gen: Well  NAD Left hand well appearing. Nontender. Normal motion. No triggering. Pulses capillary refill and sensation.   Left hand and wrist showing no significant bony abnormalities reviewed.  No results found for this or any previous visit (from the past 24 hour(s)). No results found.  Assessment and Plan: 59 y.o. female with left hand and wrist pain. Likely related to some degenerative changes. Would consider trial of steroid injection into the radiocarpal joint versus thumb MCP based on degree of pain. However she would like to wait on a more conservative trial of meloxicam and topical diclofenac gel.   Unfortunately I am not able to be this patient's primary care provider. She was referred to me for sports medicine specialty related care. We'll send back to her PCP for her other primary care-related issues.  [place] interpreter used for today's visit. Discussed warning signs or symptoms. Please see discharge instructions. Patient expresses understanding.

## 2015-03-09 ENCOUNTER — Ambulatory Visit (INDEPENDENT_AMBULATORY_CARE_PROVIDER_SITE_OTHER): Payer: No Typology Code available for payment source | Admitting: Gastroenterology

## 2015-03-09 ENCOUNTER — Encounter: Payer: Self-pay | Admitting: Gastroenterology

## 2015-03-09 ENCOUNTER — Other Ambulatory Visit: Payer: Self-pay

## 2015-03-09 VITALS — BP 124/76 | HR 59 | Temp 97.3°F | Ht 62.0 in | Wt 132.8 lb

## 2015-03-09 DIAGNOSIS — K625 Hemorrhage of anus and rectum: Secondary | ICD-10-CM

## 2015-03-09 DIAGNOSIS — R1013 Epigastric pain: Secondary | ICD-10-CM | POA: Insufficient documentation

## 2015-03-09 NOTE — Assessment & Plan Note (Signed)
Intermittent chronic brbpr likely benign anorectal source. No prior colonoscopy. TCS in near future.  I have discussed the risks, alternatives, benefits with regards to but not limited to the risk of reaction to medication, bleeding, infection, perforation and the patient is agreeable to proceed. Written consent to be obtained.

## 2015-03-09 NOTE — Assessment & Plan Note (Signed)
59 y/o female with chronic epigastric pain, intermittent in nature. More recently NSAID use for joint pain. Takes omeprazole without relief. No postprandial component. ?gastritis, PUD, atypical GERD. Plan on EGD in near future for further evaluation.  I have discussed the risks, alternatives, benefits with regards to but not limited to the risk of reaction to medication, bleeding, infection, perforation and the patient is agreeable to proceed. Written consent to be obtained.

## 2015-03-09 NOTE — Progress Notes (Signed)
Primary Care Physician:  Concepcion LivingBERNHARDT, LINDA, NP  Primary Gastroenterologist:  Roetta SessionsMichael Rourk, MD   Chief Complaint  Patient presents with  . Abdominal Pain  . set up TCS    HPI:  Erin Nielsen is a 59 y.o. female here to set up colonoscopy and for abdominal pain. She was referred by Concepcion LivingLinda Bernhardt, NP. Patient has never had a colonoscopy. She presents with her son-in-law who provides interpretation. She speaks Congohinese Air traffic controller(Mandarin) and both her and her son-in-law refuse language interpreter and sign appropriate papers.   She has 10 year h/o intermittent epigastric pain. Usually happens in late evenings. No postprandial component. Sometimes putting pressure on abdomen helps. No heartburn or vomiting. No weight loss. No dysphagia. Has been on Advil and Mobic more recently for trigger finger. Currently on voltaren gel. BM regular. No melena. +brbpr on toilet tissue.    No prior EGD/colonoscopy.    Current Outpatient Prescriptions  Medication Sig Dispense Refill  . diclofenac sodium (VOLTAREN) 1 % GEL Apply 2 g topically 3 (three) times daily. Apply 1 or 2 gm each application 100 g 12  . Ginkgo Biloba Extract 60 MG CAPS Take by mouth.    . meclizine (ANTIVERT) 50 MG tablet Take 0.5 tablets (25 mg total) by mouth 3 (three) times daily as needed for dizziness or nausea. 30 tablet 1  . Omega-3 Fatty Acids (FISH OIL PO) Take by mouth.    Marland Kitchen. omeprazole (PRILOSEC) 10 MG capsule Take 10 mg by mouth daily.     No current facility-administered medications for this visit.    Allergies as of 03/09/2015  . (No Known Allergies)    Past Medical History  Diagnosis Date  . Arthritis   . Hyperlipidemia   . Dizziness     No past surgical history on file.  Family History  Problem Relation Age of Onset  . Colon cancer Neg Hx   . Tongue cancer Brother     Social History   Social History  . Marital Status: Married    Spouse Name: N/A  . Number of Children: 1  . Years of Education: N/A    Occupational History  . Not on file.   Social History Main Topics  . Smoking status: Never Smoker   . Smokeless tobacco: Never Used  . Alcohol Use: No  . Drug Use: Not on file  . Sexual Activity: Not on file   Other Topics Concern  . Not on file   Social History Narrative      ROS:  General: Negative for anorexia, weight loss, fever, chills, fatigue, weakness. Eyes: Negative for vision changes.  ENT: Negative for hoarseness, difficulty swallowing , nasal congestion. CV: Negative for chest pain, angina, palpitations, dyspnea on exertion, peripheral edema.  Respiratory: Negative for dyspnea at rest, dyspnea on exertion, cough, sputum, wheezing.  GI: See history of present illness. GU:  Negative for dysuria, hematuria, urinary incontinence, urinary frequency, nocturnal urination.  MS: Negative for, low back pain. +finger pain, joint pain Derm: Negative for rash or itching.  Neuro: Negative for weakness, abnormal sensation, seizure, frequent headaches, memory loss, confusion.  Psych: Negative for anxiety, depression, suicidal ideation, hallucinations.  Endo: Negative for unusual weight change.  Heme: Negative for bruising or bleeding. Allergy: Negative for rash or hives.    Physical Examination:  BP 124/76 mmHg  Pulse 59  Temp(Src) 97.3 F (36.3 C)  Ht 5\' 2"  (1.575 m)  Wt 132 lb 12.8 oz (60.238 kg)  BMI 24.28 kg/m2   General:  Well-nourished, well-developed in no acute distress.  Head: Normocephalic, atraumatic.   Eyes: Conjunctiva pink, no icterus. Mouth: Oropharyngeal mucosa moist and pink , no lesions erythema or exudate. Neck: Supple without thyromegaly, masses, or lymphadenopathy.  Lungs: Clear to auscultation bilaterally.  Heart: Regular rate and rhythm, no murmurs rubs or gallops.  Abdomen: Bowel sounds are normal, nontender, nondistended, no hepatosplenomegaly or masses, no abdominal bruits or    hernia , no rebound or guarding.   Rectal: not  performed Extremities: No lower extremity edema. No clubbing or deformities.  Neuro: Alert and oriented x 4 , grossly normal neurologically.  Skin: Warm and dry, no rash or jaundice.   Psych: Alert and cooperative, normal mood and affect.  Labs: Lab Results  Component Value Date   WBC 6.5 02/13/2015   HGB 12.7 02/13/2015   HCT 39.8 02/13/2015   MCV 90.7 02/13/2015   PLT 245 02/13/2015   No results found for: CREATININE, BUN, NA, K, CL, CO2 No results found for: ALT, AST, GGT, ALKPHOS, BILITOT  HCV Ab negative  Imaging Studies: No results found.    

## 2015-03-09 NOTE — Patient Instructions (Addendum)
1. Colonoscopy and upper endoscopy with Dr. Rourk. Please see separate instructions.  

## 2015-03-10 ENCOUNTER — Other Ambulatory Visit: Payer: Self-pay

## 2015-03-10 DIAGNOSIS — R1013 Epigastric pain: Secondary | ICD-10-CM

## 2015-03-10 DIAGNOSIS — K625 Hemorrhage of anus and rectum: Secondary | ICD-10-CM

## 2015-03-10 NOTE — Progress Notes (Signed)
CC'ED TO PCP 

## 2015-03-14 ENCOUNTER — Encounter: Payer: Self-pay | Admitting: Family Medicine

## 2015-03-14 ENCOUNTER — Ambulatory Visit: Payer: No Typology Code available for payment source | Attending: Family Medicine | Admitting: Family Medicine

## 2015-03-14 VITALS — BP 150/83 | HR 58 | Temp 98.5°F | Resp 16 | Ht 62.0 in | Wt 134.0 lb

## 2015-03-14 DIAGNOSIS — R001 Bradycardia, unspecified: Secondary | ICD-10-CM | POA: Insufficient documentation

## 2015-03-14 DIAGNOSIS — M65312 Trigger thumb, left thumb: Secondary | ICD-10-CM | POA: Insufficient documentation

## 2015-03-14 DIAGNOSIS — Z114 Encounter for screening for human immunodeficiency virus [HIV]: Secondary | ICD-10-CM

## 2015-03-14 DIAGNOSIS — I10 Essential (primary) hypertension: Secondary | ICD-10-CM | POA: Insufficient documentation

## 2015-03-14 DIAGNOSIS — Z0000 Encounter for general adult medical examination without abnormal findings: Secondary | ICD-10-CM

## 2015-03-14 DIAGNOSIS — H8110 Benign paroxysmal vertigo, unspecified ear: Secondary | ICD-10-CM | POA: Insufficient documentation

## 2015-03-14 MED ORDER — MELOXICAM 15 MG PO TABS: 15.0000 mg | ORAL_TABLET | Freq: Every day | ORAL | Status: DC

## 2015-03-14 NOTE — Progress Notes (Signed)
Subjective:  Patient ID: Erin Nielsen, female    DOB: [birth year]  Age: 59 y.o. MRN: [id number]  CC: Dizziness   HPI Irean Kendricks presents for    1. Dizziness: patient here for dizziness. Dizziness has improved. She is not dizzy now. 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 She gets dizzy when riding in the car. She gets severe dizziness with nausea at night but has not had this in a while.   2. HTN: she has hx of HTN diagnosed 2 years ago. BP at recent OV  Have been normal. No CP or SOB or swelling.   3. Headache: tension at temples and on top of head. No vision loss. She has blurry vision with reading close up.   4. Dental pain: she request dental referral. No oral pain or swelling.   Social History  Substance Use Topics  . Smoking status: Never Smoker   . Smokeless tobacco: Never Used  . Alcohol Use: No   Past Medical History  Diagnosis Date  . Arthritis   . Hyperlipidemia   . Dizziness     No past surgical history on file.  Family History  Problem Relation Age of Onset  . Colon cancer Neg Hx   . Tongue cancer Brother     Social History  Substance Use Topics  . Smoking status: Never Smoker   . Smokeless tobacco: Never Used  . Alcohol Use: No    ROS Review of Systems  Constitutional: Negative for fever and chills.  Eyes: Negative for visual disturbance.  Respiratory: Negative for shortness of breath.   Cardiovascular: Negative for chest pain.  Gastrointestinal: Negative for abdominal pain and blood in stool.  Musculoskeletal: Negative for back pain and arthralgias.  Skin: Negative for rash.  Allergic/Immunologic: Negative for immunocompromised state.  Neurological: Positive for headaches. Negative for dizziness.  Hematological: Negative for adenopathy. Does not bruise/bleed easily.  Psychiatric/Behavioral: Negative for suicidal ideas and dysphoric mood.    Objective:   Today's Vitals: BP 150/83 mmHg  Pulse 58  Temp(Src) 98.5 F (36.9 C)  Resp 16   Ht  (1.575 m)  Wt 134 lb (60.782 kg)  BMI 24.50 kg/m2  SpO2 98%  BP Readings from Last 3 Encounters:  03/14/15 150/83  03/09/15 124/76  02/16/15 128/71    Physical Exam  Constitutional: She is oriented to person, place, and time. She appears well-developed and well-nourished. No distress.  HENT:  Head: Normocephalic and atraumatic.  Cardiovascular: Regular rhythm, normal heart sounds and intact distal pulses.  Bradycardia present.   Pulmonary/Chest: Effort normal and breath sounds normal.  Musculoskeletal: She exhibits no edema.  Neurological: She is alert and oriented to person, place, and time.  Skin: Skin is warm and dry. No rash noted.  Psychiatric: She has a normal mood and affect.   EKG: sinus bradycardia    Assessment & Plan:   Lajuan was seen today for dizziness.  Diagnoses and all orders for this visit:  Bradycardia -     EKG 12-Lead; Standing -     EKG 12-Lead -     TSH  Essential hypertension -     COMPLETE METABOLIC PANEL WITH GFR  Screening for HIV (human immunodeficiency virus) -     HIV antibody (with reflex)  Healthcare maintenance -     Ambulatory referral to Dentistry  Trigger thumb of left hand -     Discontinue: meloxicam (MOBIC) 15 MG tablet; Take 1 tablet (15 mg total) by mouth daily. -  meloxicam (MOBIC) 15 MG tablet; Take 1 tablet (15 mg total) by mouth daily.  Benign paroxysmal positional vertigo, unspecified laterality -     Vitamin D, 25-hydroxy -     TSH      Outpatient Encounter Prescriptions as of 03/14/2015  Medication Sig  . diclofenac sodium (VOLTAREN) 1 % GEL Apply 2 g topically 3 (three) times daily. Apply 1 or 2 gm each application  . Ginkgo Biloba Extract 60 MG CAPS Take by mouth.  . meclizine (ANTIVERT) 50 MG tablet Take 0.5 tablets (25 mg total) by mouth 3 (three) times daily as needed for dizziness or nausea.  . Omega-3 Fatty Acids (FISH OIL PO) Take by mouth.  Marland Kitchen. omeprazole (PRILOSEC) 10 MG capsule Take  10 mg by mouth daily.  . meloxicam (MOBIC) 15 MG tablet Take 1 tablet (15 mg total) by mouth daily.  . [DISCONTINUED] meloxicam (MOBIC) 15 MG tablet Take 1 tablet (15 mg total) by mouth daily.   No facility-administered encounter medications on file as of 03/14/2015.    Follow-up: No Follow-up on file.    Dessa PhiJosalyn Giordano Getman MD

## 2015-03-14 NOTE — Addendum Note (Signed)
Addended by: Dessa PhiFUNCHES, Iden Stripling on: 03/14/2015 05:28 PM   Modules accepted: Orders

## 2015-03-14 NOTE — Assessment & Plan Note (Signed)
Sinus bradycardia on EKG

## 2015-03-14 NOTE — Patient Instructions (Addendum)
Erin Nielsen was seen today for dizziness.  Diagnoses and all orders for this visit:  Bradycardia -     EKG 12-Lead; Standing -     EKG 12-Lead -     TSH  Essential hypertension -     COMPLETE METABOLIC PANEL WITH GFR  Screening for HIV (human immunodeficiency virus) -     HIV antibody (with reflex)  Healthcare maintenance -     Ambulatory referral to Dentistry  Trigger thumb of left hand -     Discontinue: meloxicam (MOBIC) 15 MG tablet; Take 1 tablet (15 mg total) by mouth daily. -     meloxicam (MOBIC) 15 MG tablet; Take 1 tablet (15 mg total) by mouth daily.  Benign paroxysmal positional vertigo, unspecified laterality -     Vitamin D, 25-hydroxy -     TSH    F/u in 2 weeks for blood pressure check  Dr. Armen PickupFunches

## 2015-03-14 NOTE — Progress Notes (Signed)
C/C Dizziness, [facility] referral  Pain scale #2 Mandarin interpretern [phone]#[id number]

## 2015-03-15 LAB — COMPLETE METABOLIC PANEL WITH GFR
ALBUMIN: 3.9 g/dL (ref 3.6–5.1)
ALK PHOS: 46 U/L (ref 33–130)
ALT: 16 U/L (ref 6–29)
AST: 25 U/L (ref 10–35)
BUN: 14 mg/dL (ref 7–25)
CALCIUM: 8.7 mg/dL (ref 8.6–10.4)
CHLORIDE: 101 mmol/L (ref 98–110)
CO2: 27 mmol/L (ref 20–31)
Creat: 0.75 mg/dL (ref 0.50–1.05)
GFR, EST NON AFRICAN AMERICAN: 88 mL/min (ref >=60)
Glucose, Bld: 107 mg/dL — ABNORMAL HIGH (ref 65–99)
POTASSIUM: 4.1 mmol/L (ref 3.5–5.3)
Sodium: 136 mmol/L (ref 135–146)
Total Bilirubin: 0.4 mg/dL (ref 0.2–1.2)
Total Protein: 6.6 g/dL (ref 6.1–8.1)

## 2015-03-15 LAB — HIV ANTIBODY (ROUTINE TESTING W REFLEX): HIV: NONREACTIVE

## 2015-03-15 LAB — TSH: TSH: 2.451 u[IU]/mL (ref 0.350–4.500)

## 2015-03-15 LAB — VITAMIN D 25 HYDROXY (VIT D DEFICIENCY, FRACTURES): VIT D 25 HYDROXY: 29 ng/mL — AB (ref 30–100)

## 2015-03-22 ENCOUNTER — Telehealth: Payer: Self-pay | Admitting: *Deleted

## 2015-03-22 NOTE — Telephone Encounter (Signed)
Used [employer] interpreter Cong[place]inese #[id number] Pt unable to received call  Lab Results given to Erin Nielsen  Pt Vit D level insufficiency  Advised to take 2000IU D3 daily

## 2015-03-22 NOTE — Telephone Encounter (Signed)
-----   Message from Dessa PhiJosalyn Funches, MD sent at 03/15/2015  8:31 AM EDT ----- Studies normal except for vit D insufficiency Please take 2000 IU of D3 daily

## 2015-03-30 ENCOUNTER — Encounter (HOSPITAL_COMMUNITY)
Admission: RE | Disposition: A | Discharge status: Home / Self Care | Payer: Self-pay | Source: Ambulatory Visit | Attending: Internal Medicine

## 2015-03-30 ENCOUNTER — Encounter (HOSPITAL_COMMUNITY): Payer: Self-pay | Admitting: *Deleted

## 2015-03-30 ENCOUNTER — Ambulatory Visit (HOSPITAL_COMMUNITY)
Admission: RE | Admit: 2015-03-30 | Discharge: 2015-03-30 | Disposition: A | Discharge status: Home / Self Care | Payer: No Typology Code available for payment source | Source: Ambulatory Visit | Attending: Internal Medicine | Admitting: Internal Medicine

## 2015-03-30 DIAGNOSIS — Z79899 Other long term (current) drug therapy: Secondary | ICD-10-CM | POA: Insufficient documentation

## 2015-03-30 DIAGNOSIS — K921 Melena: Secondary | ICD-10-CM | POA: Insufficient documentation

## 2015-03-30 DIAGNOSIS — K648 Other hemorrhoids: Secondary | ICD-10-CM | POA: Insufficient documentation

## 2015-03-30 DIAGNOSIS — K649 Unspecified hemorrhoids: Secondary | ICD-10-CM | POA: Insufficient documentation

## 2015-03-30 DIAGNOSIS — R1013 Epigastric pain: Secondary | ICD-10-CM | POA: Insufficient documentation

## 2015-03-30 DIAGNOSIS — K2950 Unspecified chronic gastritis without bleeding: Secondary | ICD-10-CM | POA: Insufficient documentation

## 2015-03-30 DIAGNOSIS — K6389 Other specified diseases of intestine: Secondary | ICD-10-CM | POA: Insufficient documentation

## 2015-03-30 DIAGNOSIS — M1990 Unspecified osteoarthritis, unspecified site: Secondary | ICD-10-CM | POA: Insufficient documentation

## 2015-03-30 DIAGNOSIS — K259 Gastric ulcer, unspecified as acute or chronic, without hemorrhage or perforation: Secondary | ICD-10-CM | POA: Insufficient documentation

## 2015-03-30 DIAGNOSIS — K625 Hemorrhage of anus and rectum: Secondary | ICD-10-CM

## 2015-03-30 DIAGNOSIS — B9681 Helicobacter pylori [H. pylori] as the cause of diseases classified elsewhere: Secondary | ICD-10-CM | POA: Insufficient documentation

## 2015-03-30 DIAGNOSIS — E785 Hyperlipidemia, unspecified: Secondary | ICD-10-CM | POA: Insufficient documentation

## 2015-03-30 HISTORY — PX: ESOPHAGOGASTRODUODENOSCOPY: SHX5428

## 2015-03-30 HISTORY — PX: COLONOSCOPY: SHX5424

## 2015-03-30 SURGERY — COLONOSCOPY
Anesthesia: Moderate Sedation

## 2015-03-30 MED ORDER — ONDANSETRON HCL 4 MG/2ML IJ SOLN
INTRAMUSCULAR | Status: DC | PRN
  Administered 2015-03-30: 4 mg via INTRAVENOUS

## 2015-03-30 MED ORDER — LIDOCAINE VISCOUS 2 % MT SOLN
OROMUCOSAL | Status: DC | PRN
  Administered 2015-03-30: 4 mL via OROMUCOSAL

## 2015-03-30 MED ORDER — STERILE WATER FOR IRRIGATION IR SOLN
Status: DC | PRN
  Administered 2015-03-30: 14:00:00

## 2015-03-30 MED ORDER — LIDOCAINE VISCOUS 2 % MT SOLN
OROMUCOSAL | Status: AC
  Filled 2015-03-30: qty 15

## 2015-03-30 MED ORDER — ONDANSETRON HCL 4 MG/2ML IJ SOLN
INTRAMUSCULAR | Status: AC
  Filled 2015-03-30: qty 2

## 2015-03-30 MED ORDER — MEPERIDINE HCL 100 MG/ML IJ SOLN
INTRAMUSCULAR | Status: DC | PRN
  Administered 2015-03-30: 25 mg via INTRAVENOUS
  Administered 2015-03-30: 50 mg via INTRAVENOUS
  Administered 2015-03-30: 25 mg via INTRAVENOUS

## 2015-03-30 MED ORDER — SODIUM CHLORIDE 0.9 % IV SOLN
INTRAVENOUS | Status: DC
  Administered 2015-03-30: 14:00:00 via INTRAVENOUS

## 2015-03-30 MED ORDER — MIDAZOLAM HCL 5 MG/5ML IJ SOLN
INTRAMUSCULAR | Status: DC | PRN
  Administered 2015-03-30 (×2): 1 mg via INTRAVENOUS
  Administered 2015-03-30: 2 mg via INTRAVENOUS

## 2015-03-30 MED ORDER — MIDAZOLAM HCL 5 MG/5ML IJ SOLN
INTRAMUSCULAR | Status: AC
  Filled 2015-03-30: qty 10

## 2015-03-30 MED ORDER — MEPERIDINE HCL 100 MG/ML IJ SOLN
INTRAMUSCULAR | Status: AC
  Filled 2015-03-30: qty 2

## 2015-03-30 NOTE — Interval H&P Note (Signed)
History and Physical Interval Note:  03/30/2015 2:04 PM  Erin Nielsen  has presented today for surgery, with the diagnosis of rectal bleeding, epigastric pain  The various methods of treatment have been discussed with the patient and family. After consideration of risks, benefits and other options for treatment, the patient has consented to  Procedure(s) with comments: COLONOSCOPY (N/A) - 1430 - pt can't come earlier/lives far away ESOPHAGOGASTRODUODENOSCOPY (EGD) (N/A) as a surgical intervention .  The patient's history has been reviewed, patient examined, no change in status, stable for surgery.  I have reviewed the patient's chart and labs.  Questions were answered to the patient's satisfaction.     Jaki Steptoe  No change. Diagnostic EGD and colonoscopy per plan.  The risks, benefits, limitations, imponderables and alternatives regarding both EGD and colonoscopy have been reviewed with the patient. Questions have been answered. All parties agreeable.

## 2015-03-30 NOTE — H&P (View-Only) (Signed)
Primary Care Physician:  Concepcion LivingBERNHARDT, LINDA, NP  Primary Gastroenterologist:  Roetta SessionsMichael Rourk, MD   Chief Complaint  Patient presents with  . Abdominal Pain  . set up TCS    HPI:  Erin DingwallChunlian Nielsen is a 59 y.o. female here to set up colonoscopy and for abdominal pain. She was referred by Concepcion LivingLinda Bernhardt, NP. Patient has never had a colonoscopy. She presents with her son-in-law who provides interpretation. She speaks Cong[place] Air traffic controller(Mandarin) and both her and her son-in-law refuse language interpreter and sign appropriate papers.   She has 10 year h/o intermittent epigastric pain. Usually happens in late evenings. No postprandial component. Sometimes putting pressure on abdomen helps. No heartburn or vomiting. No weight loss. No dysphagia. Has been on Advil and Mobic more recently for trigger finger. Currently on voltaren gel. BM regular. No melena. +brbpr on toilet tissue.    No prior EGD/colonoscopy.    Current Outpatient Prescriptions  Medication Sig Dispense Refill  . diclofenac sodium (VOLTAREN) 1 % GEL Apply 2 g topically 3 (three) times daily. Apply 1 or 2 gm each application 100 g 12  . Ginkgo Biloba Extract 60 MG CAPS Take by mouth.    . meclizine (ANTIVERT) 50 MG tablet Take 0.5 tablets (25 mg total) by mouth 3 (three) times daily as needed for dizziness or nausea. 30 tablet 1  . Omega-3 Fatty Acids (FISH OIL PO) Take by mouth.    Marland Kitchen. omeprazole (PRILOSEC) 10 MG capsule Take 10 mg by mouth daily.     No current facility-administered medications for this visit.    Allergies as of 03/09/2015  . (No Known Allergies)    Past Medical History  Diagnosis Date  . Arthritis   . Hyperlipidemia   . Dizziness     No past surgical history on file.  Family History  Problem Relation Age of Onset  . Colon cancer Neg Hx   . Tongue cancer Brother     Social History   Social History  . Marital Status: Married    Spouse Name: N/A  . Number of Children: 1  . Years of Education: N/A    Occupational History  . Not on file.   Social History Main Topics  . Smoking status: Never Smoker   . Smokeless tobacco: Never Used  . Alcohol Use: No  . Drug Use: Not on file  . Sexual Activity: Not on file   Other Topics Concern  . Not on file   Social History Narrative      ROS:  General: Negative for anorexia, weight loss, fever, chills, fatigue, weakness. Eyes: Negative for vision changes.  ENT: Negative for hoarseness, difficulty swallowing , nasal congestion. CV: Negative for chest pain, angina, palpitations, dyspnea on exertion, peripheral edema.  Respiratory: Negative for dyspnea at rest, dyspnea on exertion, cough, sputum, wheezing.  GI: See history of present illness. GU:  Negative for dysuria, hematuria, urinary incontinence, urinary frequency, nocturnal urination.  MS: Negative for, low back pain. +finger pain, joint pain Derm: Negative for rash or itching.  Neuro: Negative for weakness, abnormal sensation, seizure, frequent headaches, memory loss, confusion.  Psych: Negative for anxiety, depression, suicidal ideation, hallucinations.  Endo: Negative for unusual weight change.  Heme: Negative for bruising or bleeding. Allergy: Negative for rash or hives.    Physical Examination:  BP 124/76 mmHg  Pulse 59  Temp(Src) 97.3 F (36.3 C)  Ht 5\' 2"  (1.575 m)  Wt 132 lb 12.8 oz (60.238 kg)  BMI 24.28 kg/m2   General:  Well-nourished, well-developed in no acute distress.  Head: Normocephalic, atraumatic.   Eyes: Conjunctiva pink, no icterus. Mouth: Oropharyngeal mucosa moist and pink , no lesions erythema or exudate. Neck: Supple without thyromegaly, masses, or lymphadenopathy.  Lungs: Clear to auscultation bilaterally.  Heart: Regular rate and rhythm, no murmurs rubs or gallops.  Abdomen: Bowel sounds are normal, nontender, nondistended, no hepatosplenomegaly or masses, no abdominal bruits or    hernia , no rebound or guarding.   Rectal: not  performed Extremities: No lower extremity edema. No clubbing or deformities.  Neuro: Alert and oriented x 4 , grossly normal neurologically.  Skin: Warm and dry, no rash or jaundice.   Psych: Alert and cooperative, normal mood and affect.  Labs: Lab Results  Component Value Date   WBC 6.5 02/13/2015   HGB 12.7 02/13/2015   HCT 39.8 02/13/2015   MCV 90.7 02/13/2015   PLT 245 02/13/2015   No results found for: CREATININE, BUN, NA, K, CL, CO2 No results found for: ALT, AST, GGT, ALKPHOS, BILITOT  HCV Ab negative  Imaging Studies: No results found.

## 2015-03-30 NOTE — Discharge Instructions (Signed)
Colonoscopy Discharge Instructions  Read the instructions outlined below and refer to this sheet in the next few weeks. These discharge instructions provide you with general information on caring for yourself after you leave the hospital. Your doctor may also give you specific instructions. While your treatment has been planned according to the most current medical practices available, unavoidable complications occasionally occur. If you have any problems or questions after discharge, call Dr. Gala Romney at [phone]. ACTIVITY  You may resume your regular activity, but move at a slower pace for the next 24 hours.   Take frequent rest periods for the next 24 hours.   Walking will help get rid of the air and reduce the bloated feeling in your belly (abdomen).   No driving for 24 hours (because of the medicine (anesthesia) used during the test).    Do not sign any important legal documents or operate any machinery for 24 hours (because of the anesthesia used during the test).  NUTRITION  Drink plenty of fluids.   You may resume your normal diet as instructed by your doctor.   Begin with a light meal and progress to your normal diet. Heavy or fried foods are harder to digest and may make you feel sick to your stomach (nauseated).   Avoid alcoholic beverages for 24 hours or as instructed.  MEDICATIONS  You may resume your normal medications unless your doctor tells you otherwise.  WHAT YOU CAN EXPECT TODAY  Some feelings of bloating in the abdomen.   Passage of more gas than usual.   Spotting of blood in your stool or on the toilet paper.  IF YOU HAD POLYPS REMOVED DURING THE COLONOSCOPY:  No aspirin products for 7 days or as instructed.   No alcohol for 7 days or as instructed.   Eat a soft diet for the next 24 hours.  FINDING OUT THE RESULTS OF YOUR TEST Not all test results are available during your visit. If your test results are not back during the visit, make an appointment  with your caregiver to find out the results. Do not assume everything is normal if you have not heard from your caregiver or the medical facility. It is important for you to follow up on all of your test results.  SEEK IMMEDIATE MEDICAL ATTENTION IF:  You have more than a spotting of blood in your stool.   Your belly is swollen (abdominal distention).   You are nauseated or vomiting.   You have a temperature over 101.  You have abdominal pain or discomfort that is severe or gets worse throughout the day. EGD Discharge instructions Please read the instructions outlined below and refer to this sheet in the next few weeks. These discharge instructions provide you with general information on caring for yourself after you leave the hospital. Your doctor may also give you specific instructions. While your treatment has been planned according to the most current medical practices available, unavoidable complications occasionally occur. If you have any problems or questions after discharge, please call your doctor. ACTIVITY You may resume your regular activity but move at a slower pace for the next 24 hours.  Take frequent rest periods for the next 24 hours.  Walking will help expel (get rid of) the air and reduce the bloated feeling in your abdomen.  No driving for 24 hours (because of the anesthesia (medicine) used during the test).  You may shower.  Do not sign any important legal documents or operate any machinery for 24  hours (because of the anesthesia used during the test).  NUTRITION Drink plenty of fluids.  You may resume your normal diet.  Begin with a light meal and progress to your normal diet.  Avoid alcoholic beverages for 24 hours or as instructed by your caregiver.  MEDICATIONS You may resume your normal medications unless your caregiver tells you otherwise.  WHAT YOU CAN EXPECT TODAY You may experience abdominal discomfort such as a feeling of fullness or gas pains.   FOLLOW-UP Your doctor will discuss the results of your test with you.  SEEK IMMEDIATE MEDICAL ATTENTION IF ANY OF THE FOLLOWING OCCUR: Excessive nausea (feeling sick to your stomach) and/or vomiting.  Severe abdominal pain and distention (swelling).  Trouble swallowing.  Temperature over 101 F (37.8 C).  Rectal bleeding or vomiting of blood.    Peptic ulcer disease information provided  Stop Prilosec; begin Protonix 40 mg twice daily  Avoid nonsteroidal agents like Mobic, Advil and Aleve  Hemorrhoid and constipation information provided  Begin Benefiber 1 tablespoon twice daily  Use MiraLAX daily as needed for constipation  Anusol HC suppositories 1 per rectum at bedtime 10 days  Further recommendations to follow pending review of pathology report  Office visit with [place] in 3 months.  Abdominal ultrasound to further evaluate possible extrinsic compression on proximal stomach.  Hemorrhoids Hemorrhoids are swollen veins around the rectum or anus. There are two types of hemorrhoids:   Internal hemorrhoids. These occur in the veins just inside the rectum. They may poke through to the outside and become irritated and painful.  External hemorrhoids. These occur in the veins outside the anus and can be felt as a painful swelling or hard lump near the anus. CAUSES  Pregnancy.   Obesity.   Constipation or diarrhea.   Straining to have a bowel movement.   Sitting for long periods on the toilet.  Heavy lifting or other activity that caused you to strain.  Anal intercourse. SYMPTOMS   Pain.   Anal itching or irritation.   Rectal bleeding.   Fecal leakage.   Anal swelling.   One or more lumps around the anus.  DIAGNOSIS  Your caregiver may be able to diagnose hemorrhoids by visual examination. Other examinations or tests that may be performed include:   Examination of the rectal area with a gloved hand (digital rectal exam).   Examination of anal  canal using a small tube (scope).   A blood test if you have lost a significant amount of blood.  A test to look inside the colon (sigmoidoscopy or colonoscopy). TREATMENT Most hemorrhoids can be treated at home. However, if symptoms do not seem to be getting better or if you have a lot of rectal bleeding, your caregiver may perform a procedure to help make the hemorrhoids get smaller or remove them completely. Possible treatments include:   Placing a rubber band at the base of the hemorrhoid to cut off the circulation (rubber band ligation).   Injecting a chemical to shrink the hemorrhoid (sclerotherapy).   Using a tool to burn the hemorrhoid (infrared light therapy).   Surgically removing the hemorrhoid (hemorrhoidectomy).   Stapling the hemorrhoid to block blood flow to the tissue (hemorrhoid stapling).  HOME CARE INSTRUCTIONS   Eat foods with fiber, such as whole grains, beans, nuts, fruits, and vegetables. Ask your doctor about taking products with added fiber in them (fibersupplements).  Increase fluid intake. Drink enough water and fluids to keep your urine clear or pale yellow.  Exercise regularly.   Go to the bathroom when you have the urge to have a bowel movement. Do not wait.   Avoid straining to have bowel movements.   Keep the anal area dry and clean. Use wet toilet paper or moist towelettes after a bowel movement.   Medicated creams and suppositories may be used or applied as directed.   Only take over-the-counter or prescription medicines as directed by your caregiver.   Take warm sitz baths for 15-20 minutes, 3-4 times a day to ease pain and discomfort.   Place ice packs on the hemorrhoids if they are tender and swollen. Using ice packs between sitz baths may be helpful.   Put ice in a plastic bag.   Place a towel between your skin and the bag.   Leave the ice on for 15-20 minutes, 3-4 times a day.   Do not use a donut-shaped pillow or  sit on the toilet for long periods. This increases blood pooling and pain.  SEEK MEDICAL CARE IF:  You have increasing pain and swelling that is not controlled by treatment or medicine.  You have uncontrolled bleeding.  You have difficulty or you are unable to have a bowel movement.  You have pain or inflammation outside the area of the hemorrhoids. MAKE SURE YOU:  Understand these instructions.  Will watch your condition.  Will get help right away if you are not doing well or get worse.   This information is not intended to replace advice given to you by your health care provider. Make sure you discuss any questions you have with your health care provider.   Document Released: 04/26/2000 Document Revised: 04/15/2012 Document Reviewed: 03/03/2012 Elsevier Interactive Patient Education 2016 [employer].   Constipation, Adult Constipation is when a person has fewer than three bowel movements a week, has difficulty having a bowel movement, or has stools that are dry, hard, or larger than normal. As people grow older, constipation is more common. A low-fiber diet, not taking in enough fluids, and taking certain medicines may make constipation worse.  CAUSES   Certain medicines, such as antidepressants, pain medicine, iron supplements, antacids, and water pills.   Certain diseases, such as diabetes, irritable bowel syndrome (IBS), thyroid disease, or depression.   Not drinking enough water.   Not eating enough fiber-rich foods.   Stress or travel.   Lack of physical activity or exercise.   Ignoring the urge to have a bowel movement.   Using laxatives too much.  SIGNS AND SYMPTOMS   Having fewer than three bowel movements a week.   Straining to have a bowel movement.   Having stools that are hard, dry, or larger than normal.   Feeling full or bloated.   Pain in the lower abdomen.   Not feeling relief after having a bowel movement.  DIAGNOSIS  Your  health care provider will take a medical history and perform a physical exam. Further testing may be done for severe constipation. Some tests may include:  A barium enema X-ray to examine your rectum, colon, and, sometimes, your small intestine.   A sigmoidoscopy to examine your lower colon.   A colonoscopy to examine your entire colon. TREATMENT  Treatment will depend on the severity of your constipation and what is causing it. Some dietary treatments include drinking more fluids and eating more fiber-rich foods. Lifestyle treatments may include regular exercise. If these diet and lifestyle recommendations do not help, your health care provider may recommend taking over-the-counter  laxative medicines to help you have bowel movements. Prescription medicines may be prescribed if over-the-counter medicines do not work.  HOME CARE INSTRUCTIONS   Eat foods that have a lot of fiber, such as fruits, vegetables, whole grains, and beans.  Limit foods high in fat and processed sugars, such as french fries, hamburgers, cookies, candies, and soda.   A fiber supplement may be added to your diet if you cannot get enough fiber from foods.   Drink enough fluids to keep your urine clear or pale yellow.   Exercise regularly or as directed by your health care provider.   Go to the restroom when you have the urge to go. Do not hold it.   Only take over-the-counter or prescription medicines as directed by your health care provider. Do not take other medicines for constipation without talking to your health care provider first.  SEEK IMMEDIATE MEDICAL CARE IF:   You have bright red blood in your stool.   Your constipation lasts for more than 4 days or gets worse.   You have abdominal or rectal pain.   You have thin, pencil-like stools.   You have unexplained weight loss. MAKE SURE YOU:   Understand these instructions.  Will watch your condition.  Will get help right away if you are  not doing well or get worse.   This information is not intended to replace advice given to you by your health care provider. Make sure you discuss any questions you have with your health care provider.   Document Released: 01/26/2004 Document Revised: 05/20/2014 Document Reviewed: 02/08/2013 Elsevier Interactive Patient Education 2016 Elsevier Inc.   Peptic Ulcer A peptic ulcer is a sore in the lining of your esophagus (esophageal ulcer), stomach (gastric ulcer), or in the first part of your small intestine (duodenal ulcer). The ulcer causes erosion into the deeper tissue. CAUSES  Normally, the lining of the stomach and the small intestine protects itself from the acid that digests food. The protective lining can be damaged by:  An infection caused by a bacterium called Helicobacter pylori (H. pylori).  Regular use of nonsteroidal anti-inflammatory drugs (NSAIDs), such as ibuprofen or aspirin.  Smoking tobacco. Other risk factors include being older than 50, drinking alcohol excessively, and having a family history of ulcer disease.  SYMPTOMS   Burning pain or gnawing in the area between the chest and the belly button.  Heartburn.  Nausea and vomiting.  Bloating. The pain can be worse on an empty stomach and at night. If the ulcer results in bleeding, it can cause:  Black, tarry stools.  Vomiting of bright red blood.  Vomiting of coffee-ground-looking materials. DIAGNOSIS  A diagnosis is usually made based upon your history and an exam. Other tests and procedures may be performed to find the cause of the ulcer. Finding a cause will help determine the best treatment. Tests and procedures may include:  Blood tests, stool tests, or breath tests to check for the bacterium H. pylori.  An upper gastrointestinal (GI) series of the esophagus, stomach, and small intestine.  An endoscopy to examine the esophagus, stomach, and small intestine.  A biopsy. TREATMENT  Treatment may  include:  Eliminating the cause of the ulcer, such as smoking, NSAIDs, or alcohol.  Medicines to reduce the amount of acid in your digestive tract.  Antibiotic medicines if the ulcer is caused by the H. pylori bacterium.  An upper endoscopy to treat a bleeding ulcer.  Surgery if the bleeding is severe or  if the ulcer created a hole somewhere in the digestive system. HOME CARE INSTRUCTIONS   Avoid tobacco, alcohol, and caffeine. Smoking can increase the acid in the stomach, and continued smoking will impair the healing of ulcers.  Avoid foods and drinks that seem to cause discomfort or aggravate your ulcer.  Only take medicines as directed by your caregiver. Do not substitute over-the-counter medicines for prescription medicines without talking to your caregiver.  Keep any follow-up appointments and tests as directed. SEEK MEDICAL CARE IF:   Your do not improve within 7 days of starting treatment.  You have ongoing indigestion or heartburn. SEEK IMMEDIATE MEDICAL CARE IF:   You have sudden, sharp, or persistent abdominal pain.  You have bloody or dark black, tarry stools.  You vomit blood or vomit that looks like coffee grounds.  You become light-headed, weak, or feel faint.  You become sweaty or clammy. MAKE SURE YOU:   Understand these instructions.  Will watch your condition.  Will get help right away if you are not doing well or get worse.   This information is not intended to replace advice given to you by your health care provider. Make sure you discuss any questions you have with your health care provider.   Document Released: 04/26/2000 Document Revised: 05/20/2014 Document Reviewed: 11/27/2011 Elsevier Interactive Patient Education [employer]

## 2015-03-30 NOTE — OR Nursing (Signed)
Patient in for a EGD and TCS. Patient's son-in-law with her for interpretation. They refuse the interpreter line. Questions answered. Dr. Jena Gaussourk notified.

## 2015-03-30 NOTE — Op Note (Signed)
[facility] [address] [place], [id number]   COLONOSCOPY PROCEDURE REPORT  PATIENT: Erin Nielsen, Erin Nielsen  MR#: [id number] BIRTHDATE: 1955/06/21 , 59  yrs. old GENDER: female ENDOSCOPIST: R.  Roetta SessionsMichael Tasneem Cormier, MD FACP [facility] REFERRED XB:JYNWGNFBY:Josalyn Armen PickupFunches, MD PROCEDURE DATE:  03/30/2015 PROCEDURE:   Ileocolonoscopy?"diagnostic INDICATIONS:hematochezia MEDICATIONS: Versed 4 mg IV and Demerol 100 mg IV in divided doses. Zofran 4 mg IV. ASA CLASS:       Class II  CONSENT: The risks, benefits, alternatives and imponderables including but not limited to bleeding, perforation as well as the possibility of a missed lesion have been reviewed.  The potential for biopsy, lesion removal, etc. have also been discussed. Questions have been answered.  All parties agreeable.  Please see the history and physical in the medical record for more information.  DESCRIPTION OF PROCEDURE:   After the risks benefits and alternatives of the procedure were thoroughly explained, informed consent was obtained.  The digital rectal exam revealed no abnormalities of the rectum.   The [id number] ([id number]  endoscope was introduced through the anus and advanced to the terminal ileum which was intubated for a short distance. No adverse events experienced.   The quality of the prep was adequate  The instrument was then slowly withdrawn as the colon was fully examined. Estimated blood loss is zero unless otherwise noted in this procedure report.      COLON FINDINGS: Internal hemorrhoids present.  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  However, the remainder of the rectal and colonic mucosa appeared normal.  The distal 10 cm of terminal ileum mucosa also appeared normal.  Retroflexion was performed. .  Withdrawal time=6 minutes 0 seconds.  The scope was withdrawn and the procedure completed. COMPLICATIONS: There were  no immediate complications.  ENDOSCOPIC IMPRESSION: Melanosis coli. Internal hemorrhoids?"likely source of hematochezia. I suspect the patient may be relatively constipated from time to time.  RECOMMENDATIONS: Benefiber 1 heaping tablespoon twice daily. Use MiraLAX 17 g orally at bedtime as needed for constipation. 10 day course of Anusol suppositories one per rectum twice daily. Office visit with us in 2 months. If rectal bleeding has not improved, may be a reasonably good hemorrhoid banding candidate.    See EGD report.  eSigned:  R. Roetta SessionsMichael Fiana Gladu, MD Jerrel IvoryFACP [facility] 03/30/2015 2:51 PM   cc:  CPT CODES: ICD CODES:  The ICD and CPT codes recommended by this software are interpretations from the data that the clinical staff has captured with the software.  The verification of the translation of this report to the ICD and CPT codes and modifiers is the sole responsibility of the health care institution and practicing physician where this report was generated.  [employer] will not be held responsible for the validity of the ICD and CPT codes included on this report.  AMA assumes no liability for data contained or not contained herein. CPT is a Publishing rights managerregistered trademark of the [employer].  PATIENT NAME:  Erin Nielsen, Erin Nielsen MR#: [id number]

## 2015-03-30 NOTE — Op Note (Signed)
[facility] [address] [place], [id number]   ENDOSCOPY PROCEDURE REPORT  PATIENT: Erin Nielsen, [name]  MR#: [id number] BIRTHDATE: 18-Jan-1956 , 59  yrs. old GENDER: female ENDOSCOPIST: R.  Roetta SessionsMichael Myley Bahner, MD FACP [facility] REFERRED BY:  Dessa PhiJosalyn Funches, MD PROCEDURE DATE:  03/30/2015 PROCEDURE:  EGD w/ biopsy INDICATIONS:  Epigastric pain. MEDICATIONS: Versed 4 mg IV and Demerol 100 mg IV in divided doses. Xylocaine gel orally.  Zofran 4 mg IV ASA CLASS:      Class II  CONSENT: The risks, benefits, limitations, alternatives and imponderables have been discussed.  The potential for biopsy, esophogeal dilation, etc. have also been reviewed.  Questions have been answered.  All parties agreeable.  Please see the history and physical in the medical record for more information.  DESCRIPTION OF PROCEDURE: After the risks benefits and alternatives of the procedure were thoroughly explained, informed consent was obtained.  The EG-[id number] ([id number] endoscope was introduced through the mouth and advanced to the second portion of the duodenum , limited by Without limitations. The instrument was slowly withdrawn as the mucosa was fully examined. Estimated blood loss is zero unless otherwise noted in this procedure report.    Normal-appearing tubular esophagus.  Stomach empty. Mottled/erythematous gastric mucosa.  8 mm elliptical prepyloric gastric ulcer present.  There is questionable extrinsic compression on the cardia (please see retroflexed images).  No obvious infiltrating process or neoplasm.  Patent pylorus. Normal-appearing first and second portion of the duodenum.  The gastric ulcer and abnormal gastric mucosa was biopsied. Retroflexed views revealed as previously described.     The scope was then withdrawn from the patient and the procedure completed.  COMPLICATIONS: There were no immediate complications.  ENDOSCOPIC IMPRESSION: Normal esophagus. Abnormal stomach as  described above?"status post gastric biopsy. Gastric ulcer?"status post biopsy  RECOMMENDATIONS: Follow-up on pathology. Twice a day PPI therapy. Avoid nonsteroidal agents. See colonoscopy report.  REPEAT EXAM:  eSigned:  R. Roetta SessionsMichael Rufus Cypert, MD Jerrel IvoryFACP [facility] 03/30/2015 2:33 PM    CC:  CPT CODES: ICD CODES:  The ICD and CPT codes recommended by this software are interpretations from the data that the clinical staff has captured with the software.  The verification of the translation of this report to the ICD and CPT codes and modifiers is the sole responsibility of the health care institution and practicing physician where this report was generated.  [employer] will not be held responsible for the validity of the ICD and CPT codes included on this report.  AMA assumes no liability for data contained or not contained herein. CPT is a Publishing rights managerregistered trademark of the [employer].  PATIENT NAME:  Erin Nielsen, Jezebelle MR#: [id number]

## 2015-04-03 ENCOUNTER — Other Ambulatory Visit: Payer: Self-pay

## 2015-04-03 DIAGNOSIS — R1013 Epigastric pain: Secondary | ICD-10-CM

## 2015-04-04 ENCOUNTER — Encounter: Payer: Self-pay | Admitting: Pharmacist

## 2015-04-04 ENCOUNTER — Ambulatory Visit: Payer: No Typology Code available for payment source | Attending: Family Medicine | Admitting: Pharmacist

## 2015-04-04 ENCOUNTER — Encounter: Payer: Self-pay | Admitting: Internal Medicine

## 2015-04-04 ENCOUNTER — Telehealth: Payer: Self-pay

## 2015-04-04 VITALS — BP 124/73 | HR 57

## 2015-04-04 DIAGNOSIS — I10 Essential (primary) hypertension: Secondary | ICD-10-CM | POA: Insufficient documentation

## 2015-04-04 NOTE — Progress Notes (Signed)
S:    Patient arrives in good spirits with her son. Presents to the clinic for hypertension evaluation.   Patient reports adherence with medications.  Current BP Medications include:  none  She is concerned that low vitamin D may be the cause of her hypertension and dizziness.   Patient uses wrist blood pressure cuff at home and has seen some low readings and some high readings (high being systolics in the 140s). She checks it a different times during the day.    O:   Last 3 Office BP readings: BP Readings from Last 3 Encounters:  04/04/15 124/73  03/30/15 115/66  03/14/15 150/83    BMET    Component Value Date/Time   NA 136 03/14/2015 1718   K 4.1 03/14/2015 1718   CL 101 03/14/2015 1718   CO2 27 03/14/2015 1718   GLUCOSE 107* 03/14/2015 1718   BUN 14 03/14/2015 1718   CREATININE 0.75 03/14/2015 1718   CALCIUM 8.7 03/14/2015 1718   GFRNONAA 88 03/14/2015 1718   GFRAA >89 03/14/2015 1718    A/P: History of hypertension currently controlled on no medications.  No recommendations for any changes at this time. Told her to let us know if she sees consistent blood pressure readings >140/90, knowing that in a few months, her new blood pressure goal will be <150/90. Also educated patient and son on the inaccuracy of the wrist blood pressure cuff and to consider a regular arm cuff if they would like to continue to monitor her pressure at home. Educated patient on the effects of vitamin D deficiency.  Results reviewed and written information provided.   Total time in face-to-face counseling 20 minutes.   F/U Clinic Visit with Dr. Armen PickupFunches as directed. Patient to return to see me if she sees consistently elevated blood pressures at home.

## 2015-04-04 NOTE — Telephone Encounter (Signed)
Letter mailed to the pt. 

## 2015-04-04 NOTE — Patient Instructions (Signed)
Thank you for coming to see me today!  Your blood pressure looks great!  Let us know if you see blood pressures consistently >140/90 - it is usual for the blood pressure to change throughout the day  The blood pressure cuff for your wrist is not as accurate as the arm one so just know that it may not always be correct.  Take the vitamin D to help with your bone strength

## 2015-04-04 NOTE — Telephone Encounter (Signed)
Per RMR- Send letter to patient.  Send copy of letter with path to referring provider and PCP.   Patient needs PrevPak or generic equivalent x 14 days--hold any acid suppression and/or statin therapy patient may be taking during treatment.  Then back to twice a day PPI.   Office visit in 3 months to set up repeat EGD

## 2015-04-04 NOTE — Telephone Encounter (Signed)
PATIENT SCHEDULED  °

## 2015-04-05 ENCOUNTER — Ambulatory Visit: Payer: No Typology Code available for payment source | Attending: Family Medicine

## 2015-04-05 ENCOUNTER — Encounter (HOSPITAL_COMMUNITY): Payer: Self-pay | Admitting: Internal Medicine

## 2015-04-10 ENCOUNTER — Telehealth: Payer: Self-pay | Admitting: *Deleted

## 2015-04-10 ENCOUNTER — Ambulatory Visit (HOSPITAL_COMMUNITY)
Admission: RE | Admit: 2015-04-10 | Discharge: 2015-04-10 | Disposition: A | Discharge status: Home / Self Care | Payer: No Typology Code available for payment source | Source: Ambulatory Visit | Attending: Internal Medicine | Admitting: Internal Medicine

## 2015-04-10 DIAGNOSIS — R1013 Epigastric pain: Secondary | ICD-10-CM | POA: Insufficient documentation

## 2015-04-10 MED ORDER — AMOXICILL-CLARITHRO-LANSOPRAZ PO MISC: Freq: Two times a day (BID) | ORAL | Status: DC

## 2015-04-10 NOTE — Telephone Encounter (Signed)
PLEASE CALL PATIENT FAMILY MEMBER REGARDING RESULTS.  [phone]

## 2015-04-10 NOTE — Telephone Encounter (Signed)
pts son is aware. rx has been sent to the pharmacy.

## 2015-04-10 NOTE — Telephone Encounter (Signed)
I spoke with the son

## 2015-04-12 ENCOUNTER — Ambulatory Visit: Payer: No Typology Code available for payment source

## 2015-05-04 ENCOUNTER — Telehealth: Payer: Self-pay | Admitting: Family Medicine

## 2015-05-04 NOTE — Telephone Encounter (Signed)
Patient called to get clarification on the instructions on taking Vitamin D. Please f/u

## 2015-05-06 ENCOUNTER — Encounter (HOSPITAL_COMMUNITY): Payer: Self-pay | Admitting: *Deleted

## 2015-05-06 ENCOUNTER — Emergency Department (HOSPITAL_COMMUNITY)
Admission: EM | Admit: 2015-05-06 | Discharge: 2015-05-06 | Disposition: A | Discharge status: Home / Self Care | Payer: No Typology Code available for payment source | Attending: Emergency Medicine | Admitting: Emergency Medicine

## 2015-05-06 DIAGNOSIS — M79645 Pain in left finger(s): Secondary | ICD-10-CM | POA: Insufficient documentation

## 2015-05-06 DIAGNOSIS — Z8639 Personal history of other endocrine, nutritional and metabolic disease: Secondary | ICD-10-CM | POA: Insufficient documentation

## 2015-05-06 DIAGNOSIS — Z792 Long term (current) use of antibiotics: Secondary | ICD-10-CM | POA: Insufficient documentation

## 2015-05-06 DIAGNOSIS — Z791 Long term (current) use of non-steroidal anti-inflammatories (NSAID): Secondary | ICD-10-CM | POA: Insufficient documentation

## 2015-05-06 DIAGNOSIS — M1990 Unspecified osteoarthritis, unspecified site: Secondary | ICD-10-CM | POA: Insufficient documentation

## 2015-05-06 DIAGNOSIS — Z79899 Other long term (current) drug therapy: Secondary | ICD-10-CM | POA: Insufficient documentation

## 2015-05-06 MED ORDER — IBUPROFEN 400 MG PO TABS: 800.0000 mg | ORAL_TABLET | Freq: Once | ORAL | Status: DC

## 2015-05-06 MED ORDER — ACETAMINOPHEN 500 MG PO TABS
1000.0000 mg | ORAL_TABLET | Freq: Once | ORAL | Status: AC
  Administered 2015-05-06: 1000 mg via ORAL
  Filled 2015-05-06: qty 2

## 2015-05-06 NOTE — ED Notes (Signed)
Declined W/C at D/C and was escorted to lobby by RN. 

## 2015-05-06 NOTE — Discharge Instructions (Signed)
You have been seen today for finger pain. Follow up with PCP as needed. Return to ED should symptoms worsen. If symptoms do not improve you may return to the ED or go to the orthopedic walk-in clinic. Due to your stomach issue do not use ibuprofen. May use Tylenol for pain and diclofenac topical cream for inflammation.

## 2015-05-06 NOTE — ED Provider Notes (Signed)
CSN: [id number]     Arrival date & time 05/06/15  0729 History   First MD Initiated Contact with Patient 05/06/15 0745     Chief Complaint  Patient presents with  . Hand Pain     (Consider location/radiation/quality/duration/timing/severity/associated sxs/prior Treatment) HPI   Erin Nielsen is a 59 y.o. female, with a history of rheumatoid arthritis, presenting to the ED with left index finger pain for the last 2 hours. Pt was getting dressed, pulled on her sock, and felt a sudden intense pain in her finger and then couldn't move her finger. Pt now rates pain at 2 or 3 out of ten, achy, non-radiating.  Pt has tried some kind of lotion that she used when she had trigger finger in her left thumb some months back. Pt denies numbness, tingling, weakness, or any other complaints. Pt states that when she couldn't move her finger, her daughter pulled on it and then pt could move her finger. Patient adds that she cannot take NSAIDs due to a possible stomach issue. Patient does not speak [place], but her husband is at the bedside and readily translates for the patient.    Past Medical History  Diagnosis Date  . Arthritis   . Hyperlipidemia   . Dizziness    Past Surgical History  Procedure Laterality Date  . Colonoscopy N/A 03/30/2015    Procedure: COLONOSCOPY;  Surgeon: Corbin Ade, MD;  Location: [facility] ENDO [facility];  Service: Endoscopy;  Laterality: N/A;  1430 - pt can't come earlier/lives far away  . Esophagogastroduodenoscopy N/A 03/30/2015    Procedure: ESOPHAGOGASTRODUODENOSCOPY (EGD);  Surgeon: Corbin Ade, MD;  Location: [facility];  Service: Endoscopy;  Laterality: N/A;   Family History  Problem Relation Age of Onset  . Colon cancer Neg Hx   . Tongue cancer Brother    Social History  Substance Use Topics  . Smoking status: Never Smoker   . Smokeless tobacco: Never Used  . Alcohol Use: No   OB History    No data available     Review of Systems  Musculoskeletal:        Left index finger pain      Allergies  Review of patient's allergies indicates no known allergies.  Home Medications   Prior to Admission medications   Medication Sig Start Date End Date Taking? Authorizing Provider  amoxicillin-clarithromycin-lansoprazole [employer]) combo pack Take by mouth 2 (two) times daily. Follow package directions. 04/10/15   Corbin Ade, MD  cholecalciferol (VITAMIN D) 1000 UNITS tablet Take 1,000 Units by mouth daily.    Historical Provider, MD  diclofenac sodium (VOLTAREN) 1 % GEL Apply 2 g topically 3 (three) times daily. Apply 1 or 2 gm each application 02/16/15   Rodolph Bong, MD  Ginkgo Biloba Extract 60 MG CAPS Take by mouth.    Historical Provider, MD  meclizine (ANTIVERT) 50 MG tablet Take 0.5 tablets (25 mg total) by mouth 3 (three) times daily as needed for dizziness or nausea. 01/09/15   Henrietta Hoover, NP  Omega-3 Fatty Acids (FISH OIL PO) Take by mouth.    Historical Provider, MD  oxybutynin (DITROPAN-XL) 10 MG 24 hr tablet Take 1 tablet by mouth daily. 01/09/15   Historical Provider, MD   BP 137/71 mmHg  Pulse 62  Temp(Src) 97.6 F (36.4 C) (Oral)  Resp 20  Ht  (1.6 m)  Wt 57.607 kg  BMI 22.50 kg/m2  SpO2 100% Physical Exam  Constitutional: She appears well-developed and  well-nourished. No distress.  HENT:  Head: Normocephalic and atraumatic.  Eyes: Conjunctivae are normal. Pupils are equal, round, and reactive to light.  Cardiovascular: Normal rate and regular rhythm.   Pulmonary/Chest: Effort normal.  Musculoskeletal:  Full ROM in left hand and fingers. No discernible swelling, deformity, or erythema. Distal capillary refill normal.  Neurological: She is alert.  No numbness, tingling, or weakness in the left hand or fingers.  Skin: Skin is dry. She is not diaphoretic.  Nursing note and vitals reviewed.   ED Course  Procedures (including critical care time) Labs Review Labs Reviewed - No data to display  Imaging  Review No results found. I have personally reviewed and evaluated these images and lab results as part of my medical decision-making.   EKG Interpretation None      MDM   Final diagnoses:  Finger pain, left    [name] presents with left index finger pain.  Suspect patient may have dislocated her finger and then put back in place. Patient has no range of motion or neuro deficits. No deformity noted. Patient was offered an optional x-ray, but declined. Patient was given home care instructions and return precautions. Patient voiced understanding of these instructions and accepted the plan.  Anselm PancoastShawn C [employer], PA-C 05/06/15 0840  Doug SouSam Jacubowitz, MD 05/06/15 [phone]

## 2015-05-06 NOTE — ED Notes (Signed)
PT presents with LT index finger pain. Pain started while getting dressed. Pt felt a pop in Lt index finger and was unable to move Lt index finger for one HR. Pt moves finger on arrival to room ,skin is warm and dry and pulses are strong.

## 2015-05-17 NOTE — Telephone Encounter (Signed)
Patient came in requesting clarity on instructions for taking Vitamin D Please follow up.

## 2015-05-17 NOTE — Telephone Encounter (Signed)
Used [employer] [phone] LVM to return call

## 2015-05-18 NOTE — Telephone Encounter (Signed)
Pt notified per Dr Armen PickupFunches take Vit D3  2000 unit daily  Pt verbalized understanding

## 2015-05-25 ENCOUNTER — Encounter: Payer: Self-pay | Admitting: Family Medicine

## 2015-05-25 ENCOUNTER — Ambulatory Visit (INDEPENDENT_AMBULATORY_CARE_PROVIDER_SITE_OTHER): Payer: No Typology Code available for payment source

## 2015-05-25 ENCOUNTER — Ambulatory Visit (INDEPENDENT_AMBULATORY_CARE_PROVIDER_SITE_OTHER): Payer: No Typology Code available for payment source | Admitting: Family Medicine

## 2015-05-25 VITALS — BP 118/65 | HR 57 | Wt 131.0 lb

## 2015-05-25 DIAGNOSIS — M25532 Pain in left wrist: Secondary | ICD-10-CM

## 2015-05-25 DIAGNOSIS — M65312 Trigger thumb, left thumb: Secondary | ICD-10-CM

## 2015-05-25 DIAGNOSIS — M79645 Pain in left finger(s): Secondary | ICD-10-CM

## 2015-05-25 DIAGNOSIS — M79642 Pain in left hand: Secondary | ICD-10-CM | POA: Insufficient documentation

## 2015-05-25 DIAGNOSIS — M25512 Pain in left shoulder: Secondary | ICD-10-CM

## 2015-05-25 MED ORDER — NAPROXEN SODIUM 275 MG PO TABS: 275.0000 mg | ORAL_TABLET | Freq: Two times a day (BID) | ORAL | Status: DC

## 2015-05-25 MED ORDER — OMEPRAZOLE 40 MG PO CPDR: 40.0000 mg | DELAYED_RELEASE_CAPSULE | Freq: Every day | ORAL | Status: DC

## 2015-05-25 NOTE — Assessment & Plan Note (Signed)
Unclear etiology seems to be synovitis related. Patient did very well with oral NSAIDs. We'll use low-dose naproxen with GI prophylaxis PPI and continued diclofenac gel. Return in one month.

## 2015-05-25 NOTE — Assessment & Plan Note (Signed)
Resolved

## 2015-05-25 NOTE — Patient Instructions (Signed)
Thank you for coming in today. Take naproxen twice daily for bad hand pain.  Take naproxen with omeprazole for stomach protection.  You can take tylenol for pain daily as it will not hurt the stomach.  Do the shoulder exercises discussed below.  Return in 1 month.   Impingement Syndrome, Rotator Cuff, Bursitis With Rehab Impingement syndrome is a condition that involves inflammation of the tendons of the rotator cuff and the subacromial bursa, that causes pain in the shoulder. The rotator cuff consists of four tendons and muscles that control much of the shoulder and upper arm function. The subacromial bursa is a fluid filled sac that helps reduce friction between the rotator cuff and one of the bones of the shoulder (acromion). Impingement syndrome is usually an overuse injury that causes swelling of the bursa (bursitis), swelling of the tendon (tendonitis), and/or a tear of the tendon (strain). Strains are classified into three categories. Grade 1 strains cause pain, but the tendon is not lengthened. Grade 2 strains include a lengthened ligament, due to the ligament being stretched or partially ruptured. With grade 2 strains there is still function, although the function may be decreased. Grade 3 strains include a complete tear of the tendon or muscle, and function is usually impaired. SYMPTOMS   Pain around the shoulder, often at the outer portion of the upper arm.  Pain that gets worse with shoulder function, especially when reaching overhead or lifting.  Sometimes, aching when not using the arm.  Pain that wakes you up at night.  Sometimes, tenderness, swelling, warmth, or redness over the affected area.  Loss of strength.  Limited motion of the shoulder, especially reaching behind the back (to the back pocket or to unhook bra) or across your body.  Crackling sound (crepitation) when moving the arm.  Biceps tendon pain and inflammation (in the front of the shoulder). Worse when  bending the elbow or lifting. CAUSES  Impingement syndrome is often an overuse injury, in which chronic (repetitive) motions cause the tendons or bursa to become inflamed. A strain occurs when a force is paced on the tendon or muscle that is greater than it can withstand. Common mechanisms of injury include: Stress from sudden increase in duration, frequency, or intensity of training.  Direct hit (trauma) to the shoulder.  Aging, erosion of the tendon with normal use.  Bony bump on shoulder (acromial spur). RISK INCREASES WITH:  Contact sports (football, wrestling, boxing).  Throwing sports (baseball, tennis, volleyball).  Weightlifting and bodybuilding.  Heavy labor.  Previous injury to the rotator cuff, including impingement.  Poor shoulder strength and flexibility.  Failure to warm up properly before activity.  Inadequate protective equipment.  Old age.  Bony bump on shoulder (acromial spur). PREVENTION   Warm up and stretch properly before activity.  Allow for adequate recovery between workouts.  Maintain physical fitness:  Strength, flexibility, and endurance.  Cardiovascular fitness.  Learn and use proper exercise technique. PROGNOSIS  If treated properly, impingement syndrome usually goes away within 6 weeks. Sometimes surgery is required.  RELATED COMPLICATIONS   Longer healing time if not properly treated, or if not given enough time to heal.  Recurring symptoms, that result in a chronic condition.  Shoulder stiffness, frozen shoulder, or loss of motion.  Rotator cuff tendon tear.  Recurring symptoms, especially if activity is resumed too soon, with overuse, with a direct blow, or when using poor technique. TREATMENT  Treatment first involves the use of ice and medicine, to reduce pain  and inflammation. The use of strengthening and stretching exercises may help reduce pain with activity. These exercises may be performed at home or with a therapist.  If non-surgical treatment is unsuccessful after more than 6 months, surgery may be advised. After surgery and rehabilitation, activity is usually possible in 3 months.  MEDICATION  If pain medicine is needed, nonsteroidal anti-inflammatory medicines (aspirin and ibuprofen), or other minor pain relievers (acetaminophen), are often advised.  Do not take pain medicine for 7 days before surgery.  Prescription pain relievers may be given, if your caregiver thinks they are needed. Use only as directed and only as much as you need.  Corticosteroid injections may be given by your caregiver. These injections should be reserved for the most serious cases, because they may only be given a certain number of times. HEAT AND COLD  Cold treatment (icing) should be applied for 10 to 15 minutes every 2 to 3 hours for inflammation and pain, and immediately after activity that aggravates your symptoms. Use ice packs or an ice massage.  Heat treatment may be used before performing stretching and strengthening activities prescribed by your caregiver, physical therapist, or athletic trainer. Use a heat pack or a warm water soak. SEEK MEDICAL CARE IF:   Symptoms get worse or do not improve in 4 to 6 weeks, despite treatment.  New, unexplained symptoms develop. (Drugs used in treatment may produce side effects.) EXERCISES  RANGE OF MOTION (ROM) AND STRETCHING EXERCISES - Impingement Syndrome (Rotator Cuff  Tendinitis, Bursitis) These exercises may help you when beginning to rehabilitate your injury. Your symptoms may go away with or without further involvement from your physician, physical therapist or athletic trainer. While completing these exercises, remember:   Restoring tissue flexibility helps normal motion to return to the joints. This allows healthier, less painful movement and activity.  An effective stretch should be held for at least 30 seconds.  A stretch should never be painful. You should only  feel a gentle lengthening or release in the stretched tissue. STRETCH - Flexion, Standing  Stand with good posture. With an underhand grip on your right / left hand, and an overhand grip on the opposite hand, grasp a broomstick or cane so that your hands are a little more than shoulder width apart.  Keeping your right / left elbow straight and shoulder muscles relaxed, push the stick with your opposite hand, to raise your right / left arm in front of your body and then overhead. Raise your arm until you feel a stretch in your right / left shoulder, but before you have increased shoulder pain.  Try to avoid shrugging your right / left shoulder as your arm rises, by keeping your shoulder blade tucked down and toward your mid-back spine. Hold for __________ seconds.  Slowly return to the starting position. Repeat __________ times. Complete this exercise __________ times per day. STRETCH - Abduction, Supine  Lie on your back. With an underhand grip on your right / left hand and an overhand grip on the opposite hand, grasp a broomstick or cane so that your hands are a little more than shoulder width apart.  Keeping your right / left elbow straight and your shoulder muscles relaxed, push the stick with your opposite hand, to raise your right / left arm out to the side of your body and then overhead. Raise your arm until you feel a stretch in your right / left shoulder, but before you have increased shoulder pain.  Try to avoid  shrugging your right / left shoulder as your arm rises, by keeping your shoulder blade tucked down and toward your mid-back spine. Hold for __________ seconds.  Slowly return to the starting position. Repeat __________ times. Complete this exercise __________ times per day. ROM - Flexion, Active-Assisted  Lie on your back. You may bend your knees for comfort.  Grasp a broomstick or cane so your hands are about shoulder width apart. Your right / left hand should grip the end  of the stick, so that your hand is positioned "thumbs-up," as if you were about to shake hands.  Using your healthy arm to lead, raise your right / left arm overhead, until you feel a gentle stretch in your shoulder. Hold for __________ seconds.  Use the stick to assist in returning your right / left arm to its starting position. Repeat __________ times. Complete this exercise __________ times per day.  ROM - Internal Rotation, Supine   Lie on your back on a firm surface. Place your right / left elbow about 60 degrees away from your side. Elevate your elbow with a folded towel, so that the elbow and shoulder are the same height.  Using a broomstick or cane and your strong arm, pull your right / left hand toward your body until you feel a gentle stretch, but no increase in your shoulder pain. Keep your shoulder and elbow in place throughout the exercise.  Hold for __________ seconds. Slowly return to the starting position. Repeat __________ times. Complete this exercise __________ times per day. STRETCH - Internal Rotation  Place your right / left hand behind your back, palm up.  Throw a towel or belt over your opposite shoulder. Grasp the towel with your right / left hand.  While keeping an upright posture, gently pull up on the towel, until you feel a stretch in the front of your right / left shoulder.  Avoid shrugging your right / left shoulder as your arm rises, by keeping your shoulder blade tucked down and toward your mid-back spine.  Hold for __________ seconds. Release the stretch, by lowering your healthy hand. Repeat __________ times. Complete this exercise __________ times per day. ROM - Internal Rotation   Using an underhand grip, grasp a stick behind your back with both hands.  While standing upright with good posture, slide the stick up your back until you feel a mild stretch in the front of your shoulder.  Hold for __________ seconds. Slowly return to your starting  position. Repeat __________ times. Complete this exercise __________ times per day.  STRETCH - Posterior Shoulder Capsule   Stand or sit with good posture. Grasp your right / left elbow and draw it across your chest, keeping it at the same height as your shoulder.  Pull your elbow, so your upper arm comes in closer to your chest. Pull until you feel a gentle stretch in the back of your shoulder.  Hold for __________ seconds. Repeat __________ times. Complete this exercise __________ times per day. STRENGTHENING EXERCISES - Impingement Syndrome (Rotator Cuff Tendinitis, Bursitis) These exercises may help you when beginning to rehabilitate your injury. They may resolve your symptoms with or without further involvement from your physician, physical therapist or athletic trainer. While completing these exercises, remember:  Muscles can gain both the endurance and the strength needed for everyday activities through controlled exercises.  Complete these exercises as instructed by your physician, physical therapist or athletic trainer. Increase the resistance and repetitions only as guided.  You may  experience muscle soreness or fatigue, but the pain or discomfort you are trying to eliminate should never worsen during these exercises. If this pain does get worse, stop and make sure you are following the directions exactly. If the pain is still present after adjustments, discontinue the exercise until you can discuss the trouble with your clinician.  During your recovery, avoid activity or exercises which involve actions that place your injured hand or elbow above your head or behind your back or head. These positions stress the tissues which you are trying to heal. STRENGTH - Scapular Depression and Adduction   With good posture, sit on a firm chair. Support your arms in front of you, with pillows, arm rests, or on a table top. Have your elbows in line with the sides of your body.  Gently draw your  shoulder blades down and toward your mid-back spine. Gradually increase the tension, without tensing the muscles along the top of your shoulders and the back of your neck.  Hold for __________ seconds. Slowly release the tension and relax your muscles completely before starting the next repetition.  After you have practiced this exercise, remove the arm support and complete the exercise in standing as well as sitting position. Repeat __________ times. Complete this exercise __________ times per day.  STRENGTH - Shoulder Abductors, Isometric  With good posture, stand or sit about 4-6 inches from a wall, with your right / left side facing the wall.  Bend your right / left elbow. Gently press your right / left elbow into the wall. Increase the pressure gradually, until you are pressing as hard as you can, without shrugging your shoulder or increasing any shoulder discomfort.  Hold for __________ seconds.  Release the tension slowly. Relax your shoulder muscles completely before you begin the next repetition. Repeat __________ times. Complete this exercise __________ times per day.  STRENGTH - External Rotators, Isometric  Keep your right / left elbow at your side and bend it 90 degrees.  Step into a door frame so that the outside of your right / left wrist can press against the door frame without your upper arm leaving your side.  Gently press your right / left wrist into the door frame, as if you were trying to swing the back of your hand away from your stomach. Gradually increase the tension, until you are pressing as hard as you can, without shrugging your shoulder or increasing any shoulder discomfort.  Hold for __________ seconds.  Release the tension slowly. Relax your shoulder muscles completely before you begin the next repetition. Repeat __________ times. Complete this exercise __________ times per day.  STRENGTH - Supraspinatus   Stand or sit with good posture. Grasp a __________  weight, or an exercise band or tubing, so that your hand is "thumbs-up," like you are shaking hands.  Slowly lift your right / left arm in a "V" away from your thigh, diagonally into the space between your side and straight ahead. Lift your hand to shoulder height or as far as you can, without increasing any shoulder pain. At first, many people do not lift their hands above shoulder height.  Avoid shrugging your right / left shoulder as your arm rises, by keeping your shoulder blade tucked down and toward your mid-back spine.  Hold for __________ seconds. Control the descent of your hand, as you slowly return to your starting position. Repeat __________ times. Complete this exercise __________ times per day.  STRENGTH - External Rotators  Secure a  rubber exercise band or tubing to a fixed object (table, pole) so that it is at the same height as your right / left elbow when you are standing or sitting on a firm surface.  Stand or sit so that the secured exercise band is at your uninjured side.  Bend your right / left elbow 90 degrees. Place a folded towel or small pillow under your right / left arm, so that your elbow is a few inches away from your side.  Keeping the tension on the exercise band, pull it away from your body, as if pivoting on your elbow. Be sure to keep your body steady, so that the movement is coming only from your rotating shoulder.  Hold for __________ seconds. Release the tension in a controlled manner, as you return to the starting position. Repeat __________ times. Complete this exercise __________ times per day.  STRENGTH - Internal Rotators   Secure a rubber exercise band or tubing to a fixed object (table, pole) so that it is at the same height as your right / left elbow when you are standing or sitting on a firm surface.  Stand or sit so that the secured exercise band is at your right / left side.  Bend your elbow 90 degrees. Place a folded towel or small pillow  under your right / left arm so that your elbow is a few inches away from your side.  Keeping the tension on the exercise band, pull it across your body, toward your stomach. Be sure to keep your body steady, so that the movement is coming only from your rotating shoulder.  Hold for __________ seconds. Release the tension in a controlled manner, as you return to the starting position. Repeat __________ times. Complete this exercise __________ times per day.  STRENGTH - Scapular Protractors, Standing   Stand arms length away from a wall. Place your hands on the wall, keeping your elbows straight.  Begin by dropping your shoulder blades down and toward your mid-back spine.  To strengthen your protractors, keep your shoulder blades down, but slide them forward on your rib cage. It will feel as if you are lifting the back of your rib cage away from the wall. This is a subtle motion and can be challenging to complete. Ask your caregiver for further instruction, if you are not sure you are doing the exercise correctly.  Hold for __________ seconds. Slowly return to the starting position, resting the muscles completely before starting the next repetition. Repeat __________ times. Complete this exercise __________ times per day. STRENGTH - Scapular Protractors, Supine  Lie on your back on a firm surface. Extend your right / left arm straight into the air while holding a __________ weight in your hand.  Keeping your head and back in place, lift your shoulder off the floor.  Hold for __________ seconds. Slowly return to the starting position, and allow your muscles to relax completely before starting the next repetition. Repeat __________ times. Complete this exercise __________ times per day. STRENGTH - Scapular Protractors, Quadruped  Get onto your hands and knees, with your shoulders directly over your hands (or as close as you can be, comfortably).  Keeping your elbows locked, lift the back of  your rib cage up into your shoulder blades, so your mid-back rounds out. Keep your neck muscles relaxed.  Hold this position for __________ seconds. Slowly return to the starting position and allow your muscles to relax completely before starting the next repetition. Repeat __________  times. Complete this exercise __________ times per day.  STRENGTH - Scapular Retractors  Secure a rubber exercise band or tubing to a fixed object (table, pole), so that it is at the height of your shoulders when you are either standing, or sitting on a firm armless chair.  With a palm down grip, grasp an end of the band in each hand. Straighten your elbows and lift your hands straight in front of you, at shoulder height. Step back, away from the secured end of the band, until it becomes tense.  Squeezing your shoulder blades together, draw your elbows back toward your sides, as you bend them. Keep your upper arms lifted away from your body throughout the exercise.  Hold for __________ seconds. Slowly ease the tension on the band, as you reverse the directions and return to the starting position. Repeat __________ times. Complete this exercise __________ times per day. STRENGTH - Shoulder Extensors   Secure a rubber exercise band or tubing to a fixed object (table, pole) so that it is at the height of your shoulders when you are either standing, or sitting on a firm armless chair.  With a thumbs-up grip, grasp an end of the band in each hand. Straighten your elbows and lift your hands straight in front of you, at shoulder height. Step back, away from the secured end of the band, until it becomes tense.  Squeezing your shoulder blades together, pull your hands down to the sides of your thighs. Do not allow your hands to go behind you.  Hold for __________ seconds. Slowly ease the tension on the band, as you reverse the directions and return to the starting position. Repeat __________ times. Complete this exercise  __________ times per day.  STRENGTH - Scapular Retractors and External Rotators   Secure a rubber exercise band or tubing to a fixed object (table, pole) so that it is at the height as your shoulders, when you are either standing, or sitting on a firm armless chair.  With a palm down grip, grasp an end of the band in each hand. Bend your elbows 90 degrees and lift your elbows to shoulder height, at your sides. Step back, away from the secured end of the band, until it becomes tense.  Squeezing your shoulder blades together, rotate your shoulders so that your upper arms and elbows remain stationary, but your fists travel upward to head height.  Hold for __________ seconds. Slowly ease the tension on the band, as you reverse the directions and return to the starting position. Repeat __________ times. Complete this exercise __________ times per day.  STRENGTH - Scapular Retractors and External Rotators, Rowing   Secure a rubber exercise band or tubing to a fixed object (table, pole) so that it is at the height of your shoulders, when you are either standing, or sitting on a firm armless chair.  With a palm down grip, grasp an end of the band in each hand. Straighten your elbows and lift your hands straight in front of you, at shoulder height. Step back, away from the secured end of the band, until it becomes tense.  Step 1: Squeeze your shoulder blades together. Bending your elbows, draw your hands to your chest, as if you are rowing a boat. At the end of this motion, your hands and elbow should be at shoulder height and your elbows should be out to your sides.  Step 2: Rotate your shoulders, to raise your hands above your head. Your forearms should  be vertical and your upper arms should be horizontal.  Hold for __________ seconds. Slowly ease the tension on the band, as you reverse the directions and return to the starting position. Repeat __________ times. Complete this exercise __________ times  per day.  STRENGTH - Scapular Depressors  Find a sturdy chair without wheels, such as a dining room chair.  Keeping your feet on the floor, and your hands on the chair arms, lift your bottom up from the seat, and lock your elbows.  Keeping your elbows straight, allow gravity to pull your body weight down. Your shoulders will rise toward your ears.  Raise your body against gravity by drawing your shoulder blades down your back, shortening the distance between your shoulders and ears. Although your feet should always maintain contact with the floor, your feet should progressively support less body weight, as you get stronger.  Hold for __________ seconds. In a controlled and slow manner, lower your body weight to begin the next repetition. Repeat __________ times. Complete this exercise __________ times per day.    This information is not intended to replace advice given to you by your health care provider. Make sure you discuss any questions you have with your health care provider.   Document Released: 04/29/2005 Document Revised: 05/20/2014 Document Reviewed: 08/11/2008 [employer] Interactive Patient Education [employer]

## 2015-05-25 NOTE — Progress Notes (Signed)
Erin Nielsen is a 60 y.o. female who presents to [facility] today for follow-up hand pain and discuss new shoulder pain.  1) hand pain: Patient was seen in September initially for left trigger thumb. She received an injection and splinting which helped. She returned a month later with continued hand pain now in her thumb and ulnar wrist. X-ray was unremarkable. She was prescribed meloxicam and Voltaren gel. In the interim she developed gastritis and was advised to stop meloxicam. She notes diclofenac gel is not sufficient to control her pain. Additionally in the interim she suffered what appears to be a dislocation or subluxation of her second PIP. This was evaluated in the emergency room however patient declined x-rays. She does have pain in her MCP of her thumb PIP of her index finger and in her ulnar wrist on her left side. Pain is moderate worse with activity. She denies any continuing triggering.  2) additionally patient notes left shoulder pain. Pain is mild to moderate and intermittent. Pain is worse with reaching up and back. She denies any significant radiating pain weakness or numbness.   Past Medical History  Diagnosis Date  . Arthritis   . Hyperlipidemia   . Dizziness    Past Surgical History  Procedure Laterality Date  . Colonoscopy N/A 03/30/2015    Procedure: COLONOSCOPY;  Surgeon: Corbin Ade, MD;  [facility]: [facility];  Service: Endoscopy;  Laterality: N/A;  1430 - pt can't come earlier/lives far away  . Esophagogastroduodenoscopy N/A 03/30/2015    Procedure: ESOPHAGOGASTRODUODENOSCOPY (EGD);  Surgeon: Corbin Ade, MD;  [facility]: [facility];  Service: Endoscopy;  Laterality: N/A;   Social History  Substance Use Topics  . Smoking status: Never Smoker   . Smokeless tobacco: Never Used  . Alcohol Use: No   family history includes Tongue cancer in her  brother. There is no history of Colon cancer.  ROS as above Medications: Current Outpatient Prescriptions  Medication Sig Dispense Refill  . cholecalciferol (VITAMIN D) 1000 UNITS tablet Take 1,000 Units by mouth daily.    . diclofenac sodium (VOLTAREN) 1 % GEL Apply 2 g topically 3 (three) times daily. Apply 1 or 2 gm each application 100 g 12  . Ginkgo Biloba Extract 60 MG CAPS Take by mouth.    . lansoprazole (PREVACID) 30 MG capsule   0  . meclizine (ANTIVERT) 50 MG tablet Take 0.5 tablets (25 mg total) by mouth 3 (three) times daily as needed for dizziness or nausea. 30 tablet 1  . Omega-3 Fatty Acids (FISH OIL PO) Take by mouth.    . oxybutynin (DITROPAN-XL) 10 MG 24 hr tablet Take 1 tablet by mouth daily.  11   No current facility-administered medications for this visit.   No Known Allergies   Exam:  BP 118/65 mmHg  Pulse 57  Wt 131 lb (59.421 kg) Gen: Well NAD HEENT: EOMI,  MMM Lungs: Normal work of breathing. CTABL Heart: RRR no MRG Abd: NABS, Soft. Nondistended, Nontender Exts: Brisk capillary refill, warm and well perfused.  Left shoulder normal-appearing. Nontender. Normal shoulder range of motion over patient has pain with abduction. Positive Hawkins and Neer's test. Positive empty can test. Shoulder strength is intact and equal throughout over patient does experience some pain with resisted shoulder external rotation. Hands normal-appearing with no skin changes.  Normal motion. No triggering of the thumb. No clicking or popping. Not particularly tender to touch.  Preliminary x-ray  of left hand and left shoulder are unremarkable. Awaiting formal radiology review  No results found for this or any previous visit (from the past 24 hour(s)). No results found.   Please see individual assessment and plan sections.

## 2015-05-25 NOTE — Assessment & Plan Note (Signed)
Likely mild impingement or bursitis. Home exercise program NSAIDs return as needed.

## 2015-05-26 ENCOUNTER — Other Ambulatory Visit: Payer: Self-pay | Admitting: Physician Assistant

## 2015-05-26 MED ORDER — NAPROXEN SODIUM 550 MG PO TABS: 550.0000 mg | ORAL_TABLET | Freq: Two times a day (BID) | ORAL | Status: DC

## 2015-05-26 MED FILL — NAPROXEN SODIUM 550 MG TAB: 550 | 30 days supply | Qty: 60 | Fill #0

## 2015-05-26 NOTE — Progress Notes (Signed)
Pharmacy did not have 275bid. They only have 550 bid. Sent to pharmacy. Discussed may 1/2 tablets and take bid.   Omeprazole sent already.

## 2015-05-30 ENCOUNTER — Other Ambulatory Visit: Payer: Self-pay | Admitting: *Deleted

## 2015-05-30 DIAGNOSIS — M79645 Pain in left finger(s): Secondary | ICD-10-CM

## 2015-05-30 DIAGNOSIS — M65312 Trigger thumb, left thumb: Secondary | ICD-10-CM

## 2015-05-30 DIAGNOSIS — M25532 Pain in left wrist: Secondary | ICD-10-CM

## 2015-05-30 DIAGNOSIS — M25512 Pain in left shoulder: Secondary | ICD-10-CM

## 2015-05-30 MED ORDER — OMEPRAZOLE 40 MG PO CPDR: 40.0000 mg | DELAYED_RELEASE_CAPSULE | Freq: Every day | ORAL | Status: DC

## 2015-05-30 MED FILL — OMEPRAZOLE DR 40 MG CAPSULE: 40 | 90 days supply | Qty: 90 | Fill #0

## 2015-06-26 MED FILL — AMOXICILLIN 500 MG CAPSULE: 500 | 7 days supply | Qty: 21 | Fill #0

## 2015-06-30 ENCOUNTER — Ambulatory Visit: Payer: No Typology Code available for payment source | Admitting: Family Medicine

## 2015-07-06 ENCOUNTER — Ambulatory Visit: Payer: No Typology Code available for payment source | Admitting: Gastroenterology

## 2015-07-15 ENCOUNTER — Encounter: Payer: Self-pay | Admitting: Emergency Medicine

## 2015-07-15 ENCOUNTER — Emergency Department (INDEPENDENT_AMBULATORY_CARE_PROVIDER_SITE_OTHER)
Admission: EM | Admit: 2015-07-15 | Discharge: 2015-07-15 | Disposition: A | Discharge status: Home / Self Care | Payer: No Typology Code available for payment source | Source: Home / Self Care | Attending: Family Medicine | Admitting: Family Medicine

## 2015-07-15 DIAGNOSIS — J209 Acute bronchitis, unspecified: Secondary | ICD-10-CM

## 2015-07-15 MED ORDER — BENZONATATE 200 MG PO CAPS: 200.0000 mg | ORAL_CAPSULE | Freq: Every day | ORAL | Status: DC

## 2015-07-15 MED ORDER — AZITHROMYCIN 250 MG PO TABS: ORAL_TABLET | ORAL | Status: DC

## 2015-07-15 NOTE — Discharge Instructions (Signed)
Take plain guaifenesin (1200mg  extended release tabs such as Mucinex) twice daily, with plenty of water, for cough and congestion. Get adequate rest.   Stop all antihistamines for now, and other non-prescription cough/cold preparations. May take Ibuprofen 200mg , 4 tabs every 8 hours with food for chest/sternum discomfort.   Follow-up with family doctor if not improving about 7 to10 days.

## 2015-07-15 NOTE — ED Provider Notes (Signed)
CSN: [id number]     Arrival date & time 07/15/15  1659 History   First MD Initiated Contact with Patient 07/15/15 1717     Chief Complaint  Patient presents with  . Laryngitis      HPI Comments: Patient became hoarse about 5 days ago.  Yesterday she developed a non-productive cough, and sometimes coughs up to 30 minutes.  She denies sore throat and has had only mild nasal congestion.  No fevers, chills, and sweats.  The history is provided by the patient and a relative.    Past Medical History  Diagnosis Date  . Arthritis   . Hyperlipidemia   . Dizziness    Past Surgical History  Procedure Laterality Date  . Colonoscopy N/A 03/30/2015    Procedure: COLONOSCOPY;  Surgeon: Corbin Adeobert M Rourk, MD;  [facility];  Service: Endoscopy;  Laterality: N/A;  1430 - pt can't come earlier/lives far away  . Esophagogastroduodenoscopy N/A 03/30/2015    Procedure: ESOPHAGOGASTRODUODENOSCOPY (EGD);  Surgeon: Corbin Adeobert M Rourk, MD;  [facility];  Service: Endoscopy;  Laterality: N/A;   Family History  Problem Relation Age of Onset  . Colon cancer Neg Hx   . Tongue cancer Brother    Social History  Substance Use Topics  . Smoking status: Never Smoker   . Smokeless tobacco: Never Used  . Alcohol Use: No   OB History    No data available     Review of Systems No sore throat + hoarse + cough No pleuritic pain No wheezing + nasal congestion ? post-nasal drainage No sinus pain/pressure No itchy/red eyes No earache No hemoptysis No SOB No fever/chills No nausea No vomiting No abdominal pain No diarrhea No urinary symptoms No skin rash + fatigue No myalgias + headache Used OTC meds without relief  Allergies  Review of patient's allergies indicates no known allergies.  Home Medications   Prior to Admission medications   Medication Sig Start Date End Date Taking? Authorizing Provider  azithromycin (ZITHROMAX Z-PAK) 250 MG tablet Take 2 tabs today; then begin  one tab once daily for 4 more days. 07/15/15   Lattie HawStephen A Tayari Yankee, MD  benzonatate (TESSALON) 200 MG capsule Take 1 capsule (200 mg total) by mouth at bedtime. Take as needed for cough 07/15/15   Lattie HawStephen A Roselle Norton, MD  cholecalciferol (VITAMIN D) 1000 UNITS tablet Take 1,000 Units by mouth daily.    Historical Provider, MD  diclofenac sodium (VOLTAREN) 1 % GEL Apply 2 g topically 3 (three) times daily. Apply 1 or 2 gm each application 02/16/15   Rodolph BongEvan S Corey, MD  Ginkgo Biloba Extract 60 MG CAPS Take by mouth.    Historical Provider, MD  lansoprazole (PREVACID) 30 MG capsule  04/18/15   Historical Provider, MD  meclizine (ANTIVERT) 50 MG tablet Take 0.5 tablets (25 mg total) by mouth 3 (three) times daily as needed for dizziness or nausea. 01/09/15   Henrietta HooverLinda C Bernhardt, NP  naproxen sodium (ANAPROX) 550 MG tablet Take 1 tablet (550 mg total) by mouth 2 (two) times daily with a meal. 05/26/15   Jade L Breeback, PA-C  Omega-3 Fatty Acids (FISH OIL PO) Take by mouth.    Historical Provider, MD  omeprazole (PRILOSEC) 40 MG capsule Take 1 capsule (40 mg total) by mouth daily. 05/30/15   Rodolph BongEvan S Corey, MD  oxybutynin (DITROPAN-XL) 10 MG 24 hr tablet Take 1 tablet by mouth daily. 01/09/15   Historical Provider, MD   Meds Ordered and  Administered this Visit  Medications - No data to display  BP 122/70 mmHg  Pulse 60  Temp(Src) 98.3 F (36.8 C) (Oral)  Ht  (1.575 m)  Wt 135 lb 8 oz (61.462 kg)  BMI 24.78 kg/m2  SpO2 98% No data found.   Physical Exam Nursing notes and Vital Signs reviewed. Appearance:  Patient appears stated age, and in no acute distress Eyes:  Pupils are equal, round, and reactive to light and accomodation.  Extraocular movement is intact.  Conjunctivae are not inflamed  Ears:  Canals normal.  Tympanic membranes normal.  Nose:  Mildly congested turbinates.  No sinus tenderness.   Pharynx:  Normal Neck:  Supple.  Nontender enlarged posterior nodes are palpated bilaterally  Lungs:  Clear  to auscultation.  Breath sounds are equal.  Moving air well. Heart:  Regular rate and rhythm without murmurs, rubs, or gallops.  Abdomen:  Nontender without masses or hepatosplenomegaly.  Bowel sounds are present.  No CVA or flank tenderness.  Extremities:  No edema.  Skin:  No rash present.   ED Course  Procedures  None   MDM   1. Acute bronchitis, unspecified organism    Begin Z-pack for atypical coverage.  Prescription written for Benzonatate [facility]) to take at bedtime for night-time cough.  Take plain guaifenesin (  extended release tabs such as Mucinex) twice daily, with plenty of water, for cough and congestion. Get adequate rest.   Stop all antihistamines for now, and other non-prescription cough/cold preparations. May take Ibuprofen , 4 tabs every 8 hours with food for chest/sternum discomfort.   Follow-up with family doctor if not improving about 7 to10 days.     Lattie Haw, MD 07/18/15 [phone]

## 2015-07-15 NOTE — ED Notes (Signed)
Pt c/o that she loss her voice 5 days ago, productive cough x 1 day, HA x 2 days, took Ibuprofen.

## 2015-08-10 MED FILL — AMOXICILLIN 500 MG CAPSULE: 500 | 7 days supply | Qty: 21 | Fill #0

## 2015-09-26 MED FILL — AMOXICILLIN 500 MG CAPSULE: 500 | 7 days supply | Qty: 21 | Fill #0

## 2015-09-26 MED FILL — IBUPROFEN 800 MG TABLET: 800 | 10 days supply | Qty: 20 | Fill #0

## 2015-10-02 ENCOUNTER — Ambulatory Visit: Payer: No Typology Code available for payment source | Attending: Family Medicine

## 2015-10-27 ENCOUNTER — Encounter: Payer: Self-pay | Admitting: Family Medicine

## 2015-10-27 ENCOUNTER — Ambulatory Visit (INDEPENDENT_AMBULATORY_CARE_PROVIDER_SITE_OTHER): Payer: No Typology Code available for payment source | Admitting: Family Medicine

## 2015-10-27 VITALS — BP 119/74 | HR 64 | Wt 131.0 lb

## 2015-10-27 DIAGNOSIS — M79642 Pain in left hand: Secondary | ICD-10-CM

## 2015-10-27 DIAGNOSIS — M25562 Pain in left knee: Secondary | ICD-10-CM

## 2015-10-27 DIAGNOSIS — M25561 Pain in right knee: Secondary | ICD-10-CM | POA: Insufficient documentation

## 2015-10-27 MED ORDER — MELOXICAM 15 MG PO TABS: 15.0000 mg | ORAL_TABLET | Freq: Every day | ORAL | Status: DC

## 2015-10-27 NOTE — Progress Notes (Signed)
Erin Nielsen is a 60 y.o. female who presents to [facility] today for left knee pain, left hand pain.   1) left knee pain present for a few weeks without injury. Patient notes medial sided knee pain. She denies any locking catching or giving way. She's tried some over-the-counter medicines for pain which helped a bit. She feels well with no fevers or chills.  2) left thumb pain: Continuing. Patient is continued left thumb pain. She isolates the MCP is the area of most pain. She's had an injection in the past which worked well until recently. She continues to use diclofenac gel which helps a bit.  3) left index finger pain: Also present for a few weeks without injury. She notes pain in the MCP and PIP worse with activity. She denies any triggering. She's tried some over-the-counter medicines which have helped a bit.   Past Medical History  Diagnosis Date  . Arthritis   . Hyperlipidemia   . Dizziness    Past Surgical History  Procedure Laterality Date  . Colonoscopy N/A 03/30/2015    Procedure: COLONOSCOPY;  Surgeon: Corbin Adeobert M Rourk, MD;  [facility];  Service: Endoscopy;  Laterality: N/A;  1430 - pt can't come earlier/lives far away  . Esophagogastroduodenoscopy N/A 03/30/2015    Procedure: ESOPHAGOGASTRODUODENOSCOPY (EGD);  Surgeon: Corbin Adeobert M Rourk, MD;  [facility];  Service: Endoscopy;  Laterality: N/A;   Social History  Substance Use Topics  . Smoking status: Never Smoker   . Smokeless tobacco: Never Used  . Alcohol Use: No   family history includes Tongue cancer in her brother. There is no history of Colon cancer.  ROS:  No headache, visual changes, nausea, vomiting, diarrhea, constipation, dizziness, abdominal pain, skin rash, fevers, chills, night sweats, weight loss, swollen lymph nodes, body aches, joint swelling, muscle aches, chest pain, shortness of breath, mood changes, visual or auditory hallucinations.     Medications: Current Outpatient Prescriptions  Medication Sig Dispense Refill  . cholecalciferol (VITAMIN D) 1000 UNITS tablet Take 1,000 Units by mouth daily.    . diclofenac sodium (VOLTAREN) 1 % GEL Apply 2 g topically 3 (three) times daily. Apply 1 or 2 gm each application 100 g 12  . Ginkgo Biloba Extract 60 MG CAPS Take by mouth.    . lansoprazole (PREVACID) 30 MG capsule   0  . meclizine (ANTIVERT) 50 MG tablet Take 0.5 tablets (25 mg total) by mouth 3 (three) times daily as needed for dizziness or nausea. 30 tablet 1  . naproxen sodium (ANAPROX) 550 MG tablet Take 1 tablet (550 mg total) by mouth 2 (two) times daily with a meal. 60 tablet 1  . Omega-3 Fatty Acids (FISH OIL PO) Take by mouth.    Marland Kitchen. omeprazole (PRILOSEC) 40 MG capsule Take 1 capsule (40 mg total) by mouth daily. 30 capsule 3  . oxybutynin (DITROPAN-XL) 10 MG 24 hr tablet Take 1 tablet by mouth daily.  11  . meloxicam (MOBIC) 15 MG tablet Take 1 tablet (15 mg total) by mouth daily. 30 tablet 1   No current facility-administered medications for this visit.   No Known Allergies   Exam:  BP 119/74 mmHg  Pulse 64  Wt 131 lb (59.421 kg) General: Well Developed, well nourished, and in no acute distress.  Neuro/Psych: Alert and oriented x3, extra-ocular muscles intact, able to move all 4 extremities, sensation grossly intact. Skin: Warm and dry, no rashes noted.  Respiratory: Not  using accessory muscles, speaking in full sentences, trachea midline.  Cardiovascular: Pulses palpable, no extremity edema. Abdomen: Does not appear distended. MSK:  Left knee: Normal-appearing without effusion Range of motion 0-120 with 1+ retropatellar crepitations Tender to palpation medial joint line. Stable ligamentous exam with negative McMurray's testing. Normal gait  Left hand normal-appearing without significant effusion. Mildly tender palpation first and second MCP. No triggering throughout. Normal grip strength pulses  capillary refill and sensation.   Procedure: Real-time Ultrasound Guided Injection of Left Knee  Device: GE Logiq E  Images permanently stored and available for review in the ultrasound unit. Verbal informed consent obtained. Discussed risks and benefits of procedure. Warned about infection bleeding damage to structures skin hypopigmentation and fat atrophy among others. Patient expresses understanding and agreement Time-out conducted.  Noted no overlying erythema, induration, or other signs of local infection.  Skin prepped in a sterile fashion.  Local anesthesia: Topical Ethyl chloride.  With sterile technique and under real time ultrasound guidance: 80 mg of Kenalog and 4 mL of Marcaine injected easily.  Completed without difficulty  Pain immediately resolved suggesting accurate placement of the medication.  Advised to call if fevers/chills, erythema, induration, drainage, or persistent bleeding.  Images permanently stored and available for review in the ultrasound unit.  Impression: Technically successful ultrasound guided injection.   Procedure: Real-time Ultrasound Guided Injection of left first MCP  Device: GE Logiq E  Images permanently stored and available for review in the ultrasound unit. Verbal informed consent obtained. Discussed risks and benefits of procedure. Warned about infection bleeding damage to structures skin hypopigmentation and fat atrophy among others. Patient expresses understanding and agreement Time-out conducted.  Noted no overlying erythema, induration, or other signs of local infection.  Skin prepped in a sterile fashion.  Local anesthesia: Topical Ethyl chloride.  With sterile technique and under real time ultrasound guidance: 40 mg of Depo-Medrol and 0.1 mL of lidocaine injected easily.  Completed without difficulty  Pain immediately resolved suggesting accurate placement of the medication.  Advised to call if fevers/chills,  erythema, induration, drainage, or persistent bleeding.  Images permanently stored and available for review in the ultrasound unit.  Impression: Technically successful ultrasound guided injection.   Procedure: Real-time Ultrasound Guided Injection of left 2nd MCP Device: GE Logiq E  Images permanently stored and available for review in the ultrasound unit. Verbal informed consent obtained. Discussed risks and benefits of procedure. Warned about infection bleeding damage to structures skin hypopigmentation and fat atrophy among others. Patient expresses understanding and agreement Time-out conducted.  Noted no overlying erythema, induration, or other signs of local infection.  Skin prepped in a sterile fashion.  Local anesthesia: Topical Ethyl chloride.  With sterile technique and under real time ultrasound guidance: 40 mg of Depo-Medrol and 0.1 mL of lidocaine injected easily.  Completed without difficulty  Pain immediately resolved suggesting accurate placement of the medication.  Advised to call if fevers/chills, erythema, induration, drainage, or persistent bleeding.  Images permanently stored and available for review in the ultrasound unit.  Impression: Technically successful ultrasound guided injection.    No results found for this or any previous visit (from the past 24 hour(s)). No results found.   Assessment and Plan: 60 y.o. 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 Steroid injection today. Patient felt much better. Also treat with meloxicam. Return in one month.  2) left thumb pain: Likely due to DJD. Steroid injection today. Patient had significant improvement with injection. Meloxicam as well  as diclofenac gel. Return in one month.  3) left index finger pain: Likely due to DJD. Steroid injection today. Patient had significant improvement with injection. Meloxicam as well as diclofenac gel. Return in one month.   CC: Lora Paula, MD

## 2015-10-27 NOTE — Patient Instructions (Signed)
Thank you for coming in today. Call or go to the ER if you develop a large red swollen joint with extreme pain or oozing puss.  Return in 1 month or so for knee pain and hand pain.  Use meloxicam for pain as needed.

## 2016-03-12 MED FILL — OMEPRAZOLE DR 40 MG CAPSULE: 40 | 30 days supply | Qty: 30 | Fill #1

## 2016-03-15 ENCOUNTER — Other Ambulatory Visit: Payer: Self-pay | Admitting: Family Medicine

## 2016-03-15 DIAGNOSIS — M25512 Pain in left shoulder: Secondary | ICD-10-CM

## 2016-03-15 DIAGNOSIS — M79645 Pain in left finger(s): Secondary | ICD-10-CM

## 2016-03-15 DIAGNOSIS — M65312 Trigger thumb, left thumb: Secondary | ICD-10-CM

## 2016-03-15 DIAGNOSIS — M25532 Pain in left wrist: Secondary | ICD-10-CM

## 2016-03-18 ENCOUNTER — Other Ambulatory Visit: Payer: Self-pay | Admitting: Family Medicine

## 2016-03-18 DIAGNOSIS — M65312 Trigger thumb, left thumb: Secondary | ICD-10-CM

## 2016-03-18 DIAGNOSIS — M25532 Pain in left wrist: Secondary | ICD-10-CM

## 2016-03-18 DIAGNOSIS — M25512 Pain in left shoulder: Secondary | ICD-10-CM

## 2016-03-18 DIAGNOSIS — M79645 Pain in left finger(s): Secondary | ICD-10-CM

## 2016-05-10 ENCOUNTER — Ambulatory Visit: Payer: No Typology Code available for payment source | Attending: Family Medicine

## 2016-05-20 ENCOUNTER — Encounter: Payer: No Typology Code available for payment source | Admitting: Family Medicine

## 2016-06-04 ENCOUNTER — Encounter: Payer: Self-pay | Admitting: Family Medicine

## 2016-06-04 ENCOUNTER — Ambulatory Visit: Payer: Self-pay | Attending: Family Medicine | Admitting: Family Medicine

## 2016-06-04 VITALS — BP 121/73 | HR 61 | Temp 97.7°F | Wt 133.4 lb

## 2016-06-04 DIAGNOSIS — Z124 Encounter for screening for malignant neoplasm of cervix: Secondary | ICD-10-CM | POA: Insufficient documentation

## 2016-06-04 DIAGNOSIS — E559 Vitamin D deficiency, unspecified: Secondary | ICD-10-CM | POA: Insufficient documentation

## 2016-06-04 DIAGNOSIS — K219 Gastro-esophageal reflux disease without esophagitis: Secondary | ICD-10-CM | POA: Insufficient documentation

## 2016-06-04 LAB — COMPLETE METABOLIC PANEL WITH GFR
ALT: 15 U/L (ref 6–29)
AST: 25 U/L (ref 10–35)
Albumin: 4 g/dL (ref 3.6–5.1)
Alkaline Phosphatase: 47 U/L (ref 33–130)
BUN: 14 mg/dL (ref 7–25)
CO2: 27 mmol/L (ref 20–31)
CREATININE: 0.71 mg/dL (ref 0.50–0.99)
Calcium: 9.2 mg/dL (ref 8.6–10.4)
Chloride: 103 mmol/L (ref 98–110)
GFR, Est African American: >89 mL/min (ref >=60)
GFR, Est Non African American: >89 mL/min (ref >=60)
GLUCOSE: 108 mg/dL — AB (ref 65–99)
Potassium: 4 mmol/L (ref 3.5–5.3)
SODIUM: 138 mmol/L (ref 135–146)
TOTAL PROTEIN: 6.9 g/dL (ref 6.1–8.1)
Total Bilirubin: 0.4 mg/dL (ref 0.2–1.2)

## 2016-06-04 LAB — CBC
HCT: 37.9 % (ref 35.0–45.0)
Hemoglobin: 12.3 g/dL (ref 11.7–15.5)
MCH: 29.1 pg (ref 27.0–33.0)
MCHC: 32.5 g/dL (ref 32.0–36.0)
MCV: 89.6 fL (ref 80.0–100.0)
MPV: 8 fL (ref 7.5–12.5)
PLATELETS: 244 10*3/uL (ref 140–400)
RBC: 4.23 MIL/uL (ref 3.80–5.10)
RDW: 12.8 % (ref 11.0–15.0)
WBC: 6 10*3/uL (ref 3.8–10.8)

## 2016-06-04 MED ORDER — LANSOPRAZOLE 30 MG PO CPDR: 30.0000 mg | DELAYED_RELEASE_CAPSULE | Freq: Two times a day (BID) | ORAL | 11 refills | Status: DC

## 2016-06-04 MED FILL — LANSOPRAZOLE DR 30 MG CAP: 30 | 90 days supply | Qty: 180 | Fill #0

## 2016-06-04 NOTE — Progress Notes (Signed)
Pt is here today for physical with pap smear. Pt needs refills on Prevacid.

## 2016-06-04 NOTE — Patient Instructions (Addendum)
Erin Nielsen was seen today for annual exam.  Diagnoses and all orders for this visit:  Pap smear for cervical cancer screening -     Cytology - PAP [place]  Gastroesophageal reflux disease, esophagitis presence not specified -     lansoprazole (PREVACID) 30 MG capsule; Take 1 capsule (30 mg total) by mouth 2 (two) times daily before a meal. -     CBC -     COMPLETE METABOLIC PANEL WITH GFR  Vitamin D insufficiency -     Vitamin D, 25-hydroxy   Tumeric and heat are recommended for neck pain   You will be called with results  F/u in 1 year sooner if needed   Dr. Armen PickupFunches

## 2016-06-04 NOTE — Progress Notes (Signed)
SUBJECTIVE:  [age] y.o. female for annual routine Pap and checkup.  She reports posterior neck pain and stiffness in AM. No trauma.  Social History  Substance Use Topics  . Smoking status: Never Smoker  . Smokeless tobacco: Never Used  . Alcohol use No    Current Outpatient Prescriptions  Medication Sig Dispense Refill  . lansoprazole (PREVACID) 30 MG capsule   0  . Omega-3 Fatty Acids (FISH OIL PO) Take by mouth.    . cholecalciferol (VITAMIN D) 1000 UNITS tablet Take 1,000 Units by mouth daily.    . diclofenac sodium (VOLTAREN) 1 % GEL Apply 2 g topically 3 (three) times daily. Apply 1 or 2 gm each application (Patient not taking: Reported on 06/04/2016) 100 g 12  . Ginkgo Biloba Extract 60 MG CAPS Take by mouth.    . meclizine (ANTIVERT) 50 MG tablet Take 0.5 tablets (25 mg total) by mouth 3 (three) times daily as needed for dizziness or nausea. (Patient not taking: Reported on 06/04/2016) 30 tablet 1  . meloxicam (MOBIC) 15 MG tablet Take 1 tablet (15 mg total) by mouth daily. (Patient not taking: Reported on 06/04/2016) 30 tablet 1  . naproxen sodium (ANAPROX) 550 MG tablet Take 1 tablet (550 mg total) by mouth 2 (two) times daily with a meal. (Patient not taking: Reported on 06/04/2016) 60 tablet 1  . omeprazole (PRILOSEC) 40 MG capsule TAKE 1 CAPSULE BY MOUTH DAILY. (Patient not taking: Reported on 06/04/2016) 30 capsule 3  . oxybutynin (DITROPAN-XL) 10 MG 24 hr tablet Take 1 tablet by mouth daily.  11   No current facility-administered medications for this visit.    Allergies: Patient has no known allergies.  No LMP recorded. Patient is postmenopausal.  ROS:  Feeling well. No dyspnea or chest pain on exertion.  No abdominal pain, change in bowel habits, black or bloody stools.  No urinary tract symptoms. GYN ROS: normal menses, no abnormal bleeding, pelvic pain or discharge, no breast pain or new or enlarging lumps on self exam. No neurological complaints.  OBJECTIVE:  The patient  appears well, alert, oriented x 3, in no distress. BP 121/73 (BP Location: Left Arm, Patient Position: Sitting, Cuff Size: Small)   Pulse 61   Temp 97.7 F (36.5 C) (Oral)   Wt 133 lb 6.4 oz (60.5 kg)   SpO2 96%   BMI 24.40 kg/m  ENT normal.  Neck supple. No adenopathy or thyromegaly. PERLA. Lungs are clear, good air entry, no wheezes, rhonchi or rales. S1 and S2 normal, no murmurs, regular rate and rhythm. Abdomen soft without tenderness, guarding, mass or organomegaly. Extremities show no edema, normal peripheral pulses. Neurological is normal, no focal findings.  BREAST EXAM: breasts appear normal, no suspicious masses, no skin or nipple changes or axillary nodes  PELVIC EXAM: normal external genitalia, vulva, vagina, cervix, uterus and adnexa  ASSESSMENT:  well woman  PLAN:  pap smear

## 2016-06-05 LAB — VITAMIN D 25 HYDROXY (VIT D DEFICIENCY, FRACTURES): Vit D, 25-Hydroxy: 44 ng/mL (ref 30–100)

## 2016-06-07 ENCOUNTER — Telehealth: Payer: Self-pay

## 2016-06-07 LAB — CYTOLOGY - PAP
DIAGNOSIS: NEGATIVE
HPV: DETECTED — AB

## 2016-06-07 NOTE — Telephone Encounter (Signed)
Contacted pt to go over lab result spoke with son in law and is aware of results

## 2016-09-25 ENCOUNTER — Encounter: Payer: Self-pay | Admitting: Family Medicine

## 2016-10-06 ENCOUNTER — Emergency Department (INDEPENDENT_AMBULATORY_CARE_PROVIDER_SITE_OTHER)
Admission: EM | Admit: 2016-10-06 | Discharge: 2016-10-06 | Disposition: A | Discharge status: Home / Self Care | Payer: Self-pay | Source: Home / Self Care | Attending: Family Medicine | Admitting: Family Medicine

## 2016-10-06 ENCOUNTER — Encounter: Payer: Self-pay | Admitting: Emergency Medicine

## 2016-10-06 DIAGNOSIS — K047 Periapical abscess without sinus: Secondary | ICD-10-CM

## 2016-10-06 MED ORDER — HYDROCODONE-ACETAMINOPHEN 5-325 MG PO TABS: 1.0000 | ORAL_TABLET | Freq: Four times a day (QID) | ORAL | 0 refills | Status: DC | PRN

## 2016-10-06 MED ORDER — CEFTRIAXONE SODIUM 1 G IJ SOLR
1.0000 g | Freq: Once | INTRAMUSCULAR | Status: AC
  Administered 2016-10-06: 1 g via INTRAMUSCULAR

## 2016-10-06 MED ORDER — AMOXICILLIN-POT CLAVULANATE 875-125 MG PO TABS: 1.0000 | ORAL_TABLET | Freq: Two times a day (BID) | ORAL | 0 refills | Status: DC

## 2016-10-06 NOTE — ED Provider Notes (Signed)
[facility]    CSN: [id number] Arrival date & time: 10/06/16  1458     History   Chief Complaint Chief Complaint  Patient presents with  . Dental Pain    HPI Erin Nielsen is a 61 y.o. female.   Patient complains of 10 day history of toothache in right lower jaw at site of previous dental repairs about 10 years ago.  She has developed mild swelling in her right cheek.  No fevers, chills, and sweats.  She has had no response from left over metronidazole.   The history is provided by the patient and a relative. Language interpreter used: Relative.  Dental Pain  Location:  Lower Lower teeth location:  30/RL 1st molar, 31/RL 2nd molar and 32/RL 3rd molar Quality:  Aching and constant Severity:  Moderate Onset quality:  Gradual Duration:  10 days Timing:  Constant Progression:  Worsening Chronicity:  New Context comment:  Previous dental work Relieved by:  Nothing Worsened by:  Pressure Ineffective treatments:  NSAIDs (metronidazole) Associated symptoms: facial pain and facial swelling   Associated symptoms: no congestion, no difficulty swallowing, no drooling, no fever, no gum swelling, no headaches, no neck pain, no neck swelling, no oral bleeding, no oral lesions and no trismus     Past Medical History:  Diagnosis Date  . Arthritis   . Dizziness   . Hyperlipidemia     Patient Active Problem List   Diagnosis Date Noted  . GERD (gastroesophageal reflux disease) 06/04/2016  . Left knee pain 10/27/2015  . Shoulder pain, left 05/25/2015  . Hand pain, left 05/25/2015  . Gastric ulceration   . Hemorrhoid   . Melanosis coli   . Bradycardia 03/14/2015  . HTN (hypertension) 03/14/2015  . Rectal bleeding 03/09/2015  . Left wrist pain 01/12/2015  . Benign paroxysmal positional vertigo 01/09/2015    Past Surgical History:  Procedure Laterality Date  . COLONOSCOPY N/A 03/30/2015   Procedure: COLONOSCOPY;  Surgeon: Corbin Ade, MD;  [facility];  Service: Endoscopy;  Laterality: N/A;  1430 - pt can't come earlier/lives far away  . ESOPHAGOGASTRODUODENOSCOPY N/A 03/30/2015   Procedure: ESOPHAGOGASTRODUODENOSCOPY (EGD);  Surgeon: Corbin Ade, MD;  Location: [facility];  Service: Endoscopy;  Laterality: N/A;    OB History    No data available       Home Medications    Prior to Admission medications   Medication Sig Start Date End Date Taking? Authorizing Provider  amoxicillin-clavulanate (AUGMENTIN) 875-125 MG tablet Take 1 tablet by mouth 2 (two) times daily. Take with food 10/06/16   Lattie Haw, MD  HYDROcodone-acetaminophen (NORCO/VICODIN) 5-325 MG tablet Take 1 tablet by mouth every 6 (six) hours as needed for moderate pain. 10/06/16   Lattie Haw, MD    Family History Family History  Problem Relation Age of Onset  . Tongue cancer Brother   . Colon cancer Neg Hx     Social History Social History  Substance Use Topics  . Smoking status: Never Smoker  . Smokeless tobacco: Never Used  . Alcohol use No     Allergies   Patient has no known allergies.   Review of Systems Review of Systems  Constitutional: Negative for fever.  HENT: Positive for facial swelling. Negative for congestion, drooling and mouth sores.   Musculoskeletal: Negative for neck pain.  Neurological: Negative for headaches.  All other systems reviewed and are negative.    Physical Exam Triage Vital Signs ED Triage  Vitals  Enc Vitals Group     BP 10/06/16 1540 (!) 153/80     Pulse Rate 10/06/16 1540 (!) 55     Resp --      Temp 10/06/16 1540 98.6 F (37 C)     Temp Source 10/06/16 1540 Oral     SpO2 10/06/16 1540 99 %     Weight 10/06/16 1541 130 lb 4 oz (59.1 kg)     Height 10/06/16 1541 5\' 4"  (1.626 m)     Head Circumference --      Peak Flow --      Pain Score 10/06/16 1541 7     Pain Loc --      Pain Edu? --      Excl. in GC? --    No data found.   Updated Vital Signs BP (!) 153/80 (BP Location:  Left Arm)   Pulse (!) 55   Temp 98.6 F (37 C) (Oral)   Ht 5\' 4"  (1.626 m)   Wt 130 lb 4 oz (59.1 kg)   SpO2 99%   BMI 22.36 kg/m   Visual Acuity Right Eye Distance:   Left Eye Distance:   Bilateral Distance:    Right Eye Near:   Left Eye Near:    Bilateral Near:     Physical Exam  Constitutional: She appears well-developed and well-nourished. No distress.  HENT:  Head: Normocephalic and atraumatic.    Right Ear: Tympanic membrane, external ear and ear canal normal.  Left Ear: Tympanic membrane, external ear and ear canal normal.  Nose: Nose normal.  Mouth/Throat: Uvula is midline, oropharynx is clear and moist and mucous membranes are normal. No trismus in the jaw.    Indicated teeth in right mandible have previous dental repairs apparent; tenderness to tap.  Adjacent gingiva are tender to palpation but not fluctuant.    Tenderness and mild swelling at right angle of mandible.  Eyes: Conjunctivae and EOM are normal. Pupils are equal, round, and reactive to light.  Neck: Neck supple.  Cardiovascular: Normal heart sounds.   Pulmonary/Chest: Breath sounds normal.  Lymphadenopathy:    She has no cervical adenopathy.  Neurological: She is alert.  Skin: Skin is warm and dry.  Nursing note and vitals reviewed.    UC Treatments / Results  Labs (all labs ordered are listed, but only abnormal results are displayed) Labs Reviewed - No data to display  EKG  EKG Interpretation None       Radiology No results found.  Procedures Procedures (including critical care time)  Medications Ordered in UC Medications  cefTRIAXone (ROCEPHIN) injection 1 g (not administered)     Initial Impression / Assessment and Plan / UC Course  I have reviewed the triage vital signs and the nursing notes.  Pertinent labs & imaging results that were available during my care of the patient were reviewed by me and considered in my medical decision making (see chart for details).      Administered Rocephin 1gm IM Rx for Lortab. Begin Augmentin (amoxicillin/clavulanate) Monday 10/07/16.  May continue Ibuprofen 800mg  every 8 hours as needed.  If symptoms become significantly worse during the night or over the weekend, proceed to the local emergency room (or follow-up with dentist).  Followup with dentist in approximately 8 days.    Final Clinical Impressions(s) / UC Diagnoses   Final diagnoses:  Dental infection    New Prescriptions New Prescriptions   AMOXICILLIN-CLAVULANATE (AUGMENTIN) 875-125 MG TABLET    Take 1  tablet by mouth 2 (two) times daily. Take with food   HYDROCODONE-ACETAMINOPHEN (NORCO/VICODIN) 5-325 MG TABLET    Take 1 tablet by mouth every 6 (six) hours as needed for moderate pain.     Lattie Haw, MD 10/06/16 [phone]

## 2016-10-06 NOTE — ED Triage Notes (Signed)
Pt c/o tooth pain x 10 days, pain radiating up to right ear.  Taking 800mg  of Ibuprofen tid and taking Flagyl as well.

## 2016-10-06 NOTE — Discharge Instructions (Signed)
Begin Augmentin (amoxicillin/clavulanate) Monday 10/07/16.  May continue Ibuprofen 800mg  every 8 hours as needed.  If symptoms become significantly worse during the night or over the weekend, proceed to the local emergency room (or follow-up with dentist).

## 2016-10-25 ENCOUNTER — Ambulatory Visit: Payer: Self-pay | Attending: Family Medicine

## 2016-10-29 ENCOUNTER — Encounter: Payer: Self-pay | Admitting: *Deleted

## 2016-10-29 ENCOUNTER — Emergency Department (INDEPENDENT_AMBULATORY_CARE_PROVIDER_SITE_OTHER)
Admission: EM | Admit: 2016-10-29 | Discharge: 2016-10-29 | Disposition: A | Discharge status: Home / Self Care | Payer: No Typology Code available for payment source | Source: Home / Self Care | Attending: Family Medicine | Admitting: Family Medicine

## 2016-10-29 DIAGNOSIS — M778 Other enthesopathies, not elsewhere classified: Secondary | ICD-10-CM

## 2016-10-29 DIAGNOSIS — M779 Enthesopathy, unspecified: Secondary | ICD-10-CM

## 2016-10-29 MED ORDER — PREDNISONE 20 MG PO TABS: ORAL_TABLET | ORAL | 0 refills | Status: DC

## 2016-10-29 NOTE — Discharge Instructions (Signed)
Buddy tape fingers. °

## 2016-10-29 NOTE — ED Triage Notes (Signed)
Pt c/o LT 4th finger x 1 mth, worse x 3 days. Denies injury.

## 2016-10-29 NOTE — ED Provider Notes (Signed)
[name] [facility]    CSN: [id number] Arrival date & time: 10/29/16  1912     History   Chief Complaint Chief Complaint  Patient presents with  . Finger Injury    HPI Brookley Bianco is a 61 y.o. female.   Patient complains of pain in her left 4th finger for about one month, worse during the past 3 days.  Her son provides adequate language interpretation.  She recalls no injury.  She has been using a finger splint.   The history is provided by the patient and a relative. The history is limited by a language barrier.  Hand Pain  This is a new problem. Episode onset: one month ago. The problem occurs constantly. The problem has been gradually worsening. Associated symptoms comments: Decreased range of motion.. Nothing relieves the symptoms. Treatments tried: finger splint. The treatment provided no relief.    Past Medical History:  Diagnosis Date  . Arthritis   . Dizziness   . Hyperlipidemia     Patient Active Problem List   Diagnosis Date Noted  . GERD (gastroesophageal reflux disease) 06/04/2016  . Left knee pain 10/27/2015  . Shoulder pain, left 05/25/2015  . Hand pain, left 05/25/2015  . Gastric ulceration   . Hemorrhoid   . Melanosis coli   . Bradycardia 03/14/2015  . HTN (hypertension) 03/14/2015  . Rectal bleeding 03/09/2015  . Left wrist pain 01/12/2015  . Benign paroxysmal positional vertigo 01/09/2015    Past Surgical History:  Procedure Laterality Date  . COLONOSCOPY N/A 03/30/2015   Procedure: COLONOSCOPY;  Surgeon: Corbin Ade, MD;  [facility]: [facility];  Service: Endoscopy;  Laterality: N/A;  1430 - pt can't come earlier/lives far away  . ESOPHAGOGASTRODUODENOSCOPY N/A 03/30/2015   Procedure: ESOPHAGOGASTRODUODENOSCOPY (EGD);  Surgeon: Corbin Ade, MD;  [facility]: [facility];  Service: Endoscopy;  Laterality: N/A;    OB History    No data available       Home Medications    Prior to Admission medications   Medication Sig Start  Date End Date Taking? Authorizing Provider  predniSONE (DELTASONE) 20 MG tablet Take one tab by mouth twice daily for 5 days, then one daily for 3 days. Take with food. 10/29/16   Lattie Haw, MD    Family History Family History  Problem Relation Age of Onset  . Tongue cancer Brother   . Colon cancer Neg Hx     Social History Social History  Substance Use Topics  . Smoking status: Never Smoker  . Smokeless tobacco: Never Used  . Alcohol use No     Allergies   Patient has no known allergies.   Review of Systems Review of Systems  All other systems reviewed and are negative.    Physical Exam Triage Vital Signs ED Triage Vitals  Enc Vitals Group     BP 10/29/16 1928 127/74     Pulse Rate 10/29/16 1928 60     Resp 10/29/16 1928 16     Temp 10/29/16 1928 98.4 F (36.9 C)     Temp Source 10/29/16 1928 Oral     SpO2 10/29/16 1928 97 %     Weight 10/29/16 1929 127 lb (57.6 kg)     Height --      Head Circumference --      Peak Flow --      Pain Score 10/29/16 1929 5     Pain Loc --      Pain Edu? --  Excl. in GC? --    No data found.   Updated Vital Signs BP 127/74 (BP Location: Left Arm)   Pulse 60   Temp 98.4 F (36.9 C) (Oral)   Resp 16   Wt 127 lb (57.6 kg)   SpO2 97%   BMI 21.80 kg/m   Visual Acuity Right Eye Distance:   Left Eye Distance:   Bilateral Distance:    Right Eye Near:   Left Eye Near:    Bilateral Near:     Physical Exam  Constitutional: She appears well-developed and well-nourished. No distress.  HENT:  Head: Normocephalic.  Eyes: Pupils are equal, round, and reactive to light.  Cardiovascular: Normal rate.   Pulmonary/Chest: Effort normal.  Musculoskeletal:       Left hand: She exhibits decreased range of motion and tenderness. She exhibits normal two-point discrimination, normal capillary refill, no deformity, no laceration and no swelling. Normal sensation noted.       Hands: Left 4th finger has tenderness to  palpation over PIP and DIP joints.  Decreased range of motion.  Distal neurovascular function is intact.   Neurological: She is alert.  Skin: Skin is warm and dry.  Nursing note and vitals reviewed.    UC Treatments / Results  Labs (all labs ordered are listed, but only abnormal results are displayed) Labs Reviewed - No data to display  EKG  EKG Interpretation None       Radiology No results found.  Procedures Procedures (including critical care time)  Medications Ordered in UC Medications - No data to display   Initial Impression / Assessment and Plan / UC Course  I have reviewed the triage vital signs and the nursing notes.  Pertinent labs & imaging results that were available during my care of the patient were reviewed by me and considered in my medical decision making (see chart for details).     Finger strapped using "Buddy Tape" technique.  Begin prednisone burst/taper. Followup with Dr. Rodney Langtonhomas Thekkekandam or Dr. Clementeen GrahamEvan Corey ([facility]) if not improving about ten days (may need tendon sheath corticosteroid injection)    Final Clinical Impressions(s) / UC Diagnoses   Final diagnoses:  Tendonitis of finger    New Prescriptions New Prescriptions   PREDNISONE (DELTASONE) 20 MG TABLET    Take one tab by mouth twice daily for 5 days, then one daily for 3 days. Take with food.     Lattie HawBeese, Beauregard Jarrells A, MD 11/02/16 2206

## 2016-11-23 ENCOUNTER — Other Ambulatory Visit: Payer: Self-pay | Admitting: Family Medicine

## 2016-12-16 ENCOUNTER — Encounter: Payer: Self-pay | Admitting: Family Medicine

## 2016-12-16 ENCOUNTER — Ambulatory Visit (INDEPENDENT_AMBULATORY_CARE_PROVIDER_SITE_OTHER): Payer: No Typology Code available for payment source | Admitting: Family Medicine

## 2016-12-16 VITALS — BP 117/70 | HR 64 | Ht 64.0 in | Wt 129.0 lb

## 2016-12-16 DIAGNOSIS — M65341 Trigger finger, right ring finger: Secondary | ICD-10-CM

## 2016-12-16 MED ORDER — DICLOFENAC SODIUM 1 % TD GEL: 4.0000 g | Freq: Four times a day (QID) | TRANSDERMAL | 11 refills | Status: DC

## 2016-12-16 NOTE — Progress Notes (Signed)
Erin Nielsen is a 61 y.o. female who presents to [facility] today for right hand pain.  Patient states that she has has ongoing pain of her right hand for years. The pain is localized to the ring finger and thumb. Patient denies any history of trauma or inciting events. She has not noticed any swelling recently. Patient reports that pains are sharp and localized to the joints. Movement increases the pain. The pain is not worse at any particular time of day. Patient denies any hand stiffness. Patient reports that sometimes ring finger will become stuck in a flexed position and she needs to use the other hand to straighten it. Patient denies any family history of rheumatoid arthritis. Patient reports that a previous joint injection in her thumb increased her movement and relieved some of the pain. Patient has applied Volteran gel to both fingers and also found some relief using this.    Past Medical History:  Diagnosis Date  . Arthritis   . Dizziness   . Hyperlipidemia    Past Surgical History:  Procedure Laterality Date  . COLONOSCOPY N/A 03/30/2015   Procedure: COLONOSCOPY;  Surgeon: Corbin Adeobert M Rourk, MD;  Location: [facility];  Service: Endoscopy;  Laterality: N/A;  1430 - pt can't come earlier/lives far away  . ESOPHAGOGASTRODUODENOSCOPY N/A 03/30/2015   Procedure: ESOPHAGOGASTRODUODENOSCOPY (EGD);  Surgeon: Corbin Adeobert M Rourk, MD;  [facility]: [facility];  Service: Endoscopy;  Laterality: N/A;   Social History  Substance Use Topics  . Smoking status: Never Smoker  . Smokeless tobacco: Never Used  . Alcohol use No     ROS:  As above   Medications: Current Outpatient Prescriptions  Medication Sig Dispense Refill  . diclofenac sodium (VOLTAREN) 1 % GEL Apply 4 g topically 4 (four) times daily. To affected joint. 100 g 11   No current facility-administered medications for this visit.    No Known Allergies   Exam:  BP 117/70    Pulse 64   Ht 5\' 4"  (1.626 m)   Wt 129 lb (58.5 kg)   BMI 22.14 kg/m  General: Well Developed, well nourished, and in no acute distress.  Neuro/Psych: Alert and oriented x3, extra-ocular muscles intact, able to move all 4 extremities, sensation grossly intact. Skin: Warm and dry, no rashes noted.  Respiratory: Not using accessory muscles, speaking in full sentences, trachea midline.  Cardiovascular: Pulses palpable, no extremity edema. Abdomen: Does not appear distended. MSK: Right hand: No gross deformity or swelling noted on inspection Tenderness to palpation on ring finger at PIP, DIP, and MCP joints, tenderness to palpation on thumb at PIP and DIP joints Range of motion is full, flexion of ringer finger reproduces pain, extension of thumb reproduces pain, patient is able to make a fist  Procedure: Real-time Ultrasound Guided Injection of right 4th A1 pulley trigger injection  Device: GE Logiq E  Images permanently stored and available for review in the ultrasound unit. Verbal informed consent obtained. Discussed risks and benefits of procedure. Warned about infection bleeding damage to structures skin hypopigmentation and fat atrophy among others. Patient expresses understanding and agreement Time-out conducted.  Noted no overlying erythema, induration, or other signs of local infection.  Skin prepped in a sterile fashion.  Local anesthesia: Topical Ethyl chloride.  With sterile technique and under real time ultrasound guidance: 10mg  dexamethasone and 1ml lidocaine injected easily.  Completed without difficulty  Pain immediately resolved suggesting accurate placement of the medication.  Advised to call  if fevers/chills, erythema, induration, drainage, or persistent bleeding.  Images permanently stored and available for review in the ultrasound unit.  Impression: Technically successful ultrasound guided injection.     No results found for this or any previous visit  (from the past 48 hour(s)). No results found.    Assessment and Plan: 61 y.o. female with right hand pain. Patient's ring finger pain may be due to a trigger finger given her report of difficulty straightening finger. Patient was given a steroid injection in her right 4th A1 pulleyin clinic today. Patient was advised to continue diclofenac gel and also apply aspercreme and possibly capsaicin. Patient may also benefit from formal hand therapy. Patient will continue to monitor symptoms and return to clinic as needed or if pain worsens.    No orders of the defined types were placed in this encounter.  Meds ordered this encounter  Medications  . diclofenac sodium (VOLTAREN) 1 % GEL    Sig: Apply 4 g topically 4 (four) times daily. To affected joint.    Dispense:  100 g    Refill:  11    Discussed warning signs or symptoms. Please see discharge instructions. Patient expresses understanding.

## 2016-12-16 NOTE — Patient Instructions (Signed)
Thank you for coming in today. Apply voltaren gel for pain 4x daily.  You can also use Aspercream or IcyHot or TigerBalm.   Consider Capsaicin cream but be careful and avoid getting into your eyes, mouth or bottom.    Recheck with me as needed.

## 2017-09-11 ENCOUNTER — Ambulatory Visit: Payer: BLUE CROSS/BLUE SHIELD | Admitting: Family Medicine

## 2017-09-11 ENCOUNTER — Encounter: Payer: Self-pay | Admitting: Family Medicine

## 2017-09-11 DIAGNOSIS — M25561 Pain in right knee: Secondary | ICD-10-CM

## 2017-09-11 DIAGNOSIS — M25562 Pain in left knee: Secondary | ICD-10-CM

## 2017-09-11 DIAGNOSIS — G8929 Other chronic pain: Secondary | ICD-10-CM | POA: Diagnosis not present

## 2017-09-11 NOTE — Patient Instructions (Addendum)
Send me a copy of the MRI report and we will get this translated. If you can drop off the images too that would be great. Further treatment and recommendations will depend on these. I will bring you back after I review both of them and we will go over what to do next for your knee.  Your hand pain is due to arthritis. These are the different medications you can take for this: Tylenol  1-2 tabs three times a day for pain. Capsaicin, aspercreme, or biofreeze topically up to four times a day may also help with pain. Some supplements that may help for arthritis: Boswellia extract, curcumin, pycnogenol Aleve 1-2 tabs twice a day with food Cortisone injections are an option if a specific joint is involved. It's important that you continue to stay active. Consider physical therapy to strengthen muscles around the joint that hurts to take pressure off of the joint itself. Heat 15 minutes at a time 3-4 times a day as needed to help with pain.

## 2017-09-12 ENCOUNTER — Other Ambulatory Visit: Payer: Self-pay | Admitting: Obstetrics and Gynecology

## 2017-09-12 DIAGNOSIS — N6310 Unspecified lump in the right breast, unspecified quadrant: Secondary | ICD-10-CM

## 2017-09-13 ENCOUNTER — Encounter: Payer: Self-pay | Admitting: Family Medicine

## 2017-09-13 NOTE — Progress Notes (Signed)
PCP: Dessa Phi, MD  Subjective:   HPI: Patient is a 61 y.o. female here for bilateral knee pain.  Interpreter present for visit. Patient reports for about 2 months she's had bilateral knee pain but worse in the left knee. States she has a pain that is more of a discomfort. She did fall about 5 years ago leading to current pain she believe. She reports having done bed rest for about 1 month, topical [place] medicine. An MRI showed a fracture and meniscus tear but report is in [place]. Has difficulty keeping her left knee straight. Pain level 5/10 on left, 4/10 on right and sharp. No skin changes, numbness.  Past Medical History:  Diagnosis Date  . Arthritis   . Dizziness   . Hyperlipidemia     Current Outpatient Medications on File Prior to Visit  Medication Sig Dispense Refill  . diclofenac sodium (VOLTAREN) 1 % GEL Apply 4 g topically 4 (four) times daily. To affected joint. 100 g 11   No current facility-administered medications on file prior to visit.     Past Surgical History:  Procedure Laterality Date  . COLONOSCOPY N/A 03/30/2015   Procedure: COLONOSCOPY;  Surgeon: Corbin Ade, MD;  [facility];  Service: Endoscopy;  Laterality: N/A;  1430 - pt can't come earlier/lives far away  . ESOPHAGOGASTRODUODENOSCOPY N/A 03/30/2015   Procedure: ESOPHAGOGASTRODUODENOSCOPY (EGD);  Surgeon: Corbin Ade, MD;  [facility]: [facility];  Service: Endoscopy;  Laterality: N/A;    No Known Allergies  Social History   Socioeconomic History  . Marital status: Married    Spouse name: Not on file  . Number of children: 1  . Years of education: Not on file  . Highest education level: Not on file  Occupational History  . Not on file  Social Needs  . Financial resource strain: Not on file  . Food insecurity:    Worry: Not on file    Inability: Not on file  . Transportation needs:    Medical: Not on file    Non-medical: Not on file  Tobacco Use  .  Smoking status: Never Smoker  . Smokeless tobacco: Never Used  Substance and Sexual Activity  . Alcohol use: No  . Drug use: No  . Sexual activity: Not on file  Lifestyle  . Physical activity:    Days per week: Not on file    Minutes per session: Not on file  . Stress: Not on file  Relationships  . Social connections:    Talks on phone: Not on file    Gets together: Not on file    Attends religious service: Not on file    Active member of club or organization: Not on file    Attends meetings of clubs or organizations: Not on file    Relationship status: Not on file  . Intimate partner violence:    Fear of current or ex partner: Not on file    Emotionally abused: Not on file    Physically abused: Not on file    Forced sexual activity: Not on file  Other Topics Concern  . Not on file  Social History Narrative  . Not on file    Family History  Problem Relation Age of Onset  . Tongue cancer Brother   . Colon cancer Neg Hx     BP 132/78   Pulse (!) 58   Ht  (1.6 m)   Wt 125 lb (56.7 kg)   BMI  22.14 kg/m   Review of Systems: See HPI above.     Objective:  Physical Exam:  Gen: NAD, comfortable in exam room  Left knee: No gross deformity, ecchymoses.  Mild effusion. TTP medial and lateral joint lines. FROM with 5/5 strength. Negative ant/post drawers. Negative valgus/varus testing. Negative lachmanns. Mild pain mcmurrays, apleys; negative patellar apprehension. NV intact distally.  Right knee: No gross deformity, ecchymoses, swelling. Mild TTP medial joint line. FROM. Negative ant/post drawers. Negative valgus/varus testing. Negative lachmanns. NV intact distally.   Assessment & Plan:  1. Bilateral knee pain - worse in left knee.  MRI report provided - we will try to get this translated.  Has been told she had a fracture ?of tibia and meniscus tear.  She did rest for 1 month from weight bearing and topical medications.  History and exam suspicious for  flare of arthritis but would like to get report before going ahead with definitive treatment.  Tylenol, topical medications, aleve, supplements that may help reviewed.   At end of visit she also inquired about pain she has in joints of hands primarily at PIP, MCP of 2nd digits, base of thumbs.  We reviewed arthritis instructions.

## 2017-09-13 NOTE — Assessment & Plan Note (Signed)
worse in left knee.  MRI report provided - we will try to get this translated.  Has been told she had a fracture ?of tibia and meniscus tear.  She did rest for 1 month from weight bearing and topical medications.  History and exam suspicious for flare of arthritis but would like to get report before going ahead with definitive treatment.  Tylenol, topical medications, aleve, supplements that may help reviewed.

## 2017-09-17 ENCOUNTER — Other Ambulatory Visit: Payer: BLUE CROSS/BLUE SHIELD

## 2017-09-17 ENCOUNTER — Ambulatory Visit
Admission: RE | Admit: 2017-09-17 | Discharge: 2017-09-17 | Disposition: A | Discharge status: Home / Self Care | Payer: BLUE CROSS/BLUE SHIELD | Source: Ambulatory Visit | Attending: Obstetrics and Gynecology | Admitting: Obstetrics and Gynecology

## 2017-09-17 DIAGNOSIS — N6310 Unspecified lump in the right breast, unspecified quadrant: Secondary | ICD-10-CM

## 2017-09-22 ENCOUNTER — Encounter: Payer: Self-pay | Admitting: Family Medicine

## 2017-09-22 ENCOUNTER — Ambulatory Visit (INDEPENDENT_AMBULATORY_CARE_PROVIDER_SITE_OTHER): Payer: BLUE CROSS/BLUE SHIELD | Admitting: Family Medicine

## 2017-09-22 VITALS — BP 130/81 | HR 60 | Ht 63.0 in | Wt 131.0 lb

## 2017-09-22 DIAGNOSIS — M25561 Pain in right knee: Secondary | ICD-10-CM

## 2017-09-22 DIAGNOSIS — M25562 Pain in left knee: Secondary | ICD-10-CM

## 2017-09-22 DIAGNOSIS — M79642 Pain in left hand: Secondary | ICD-10-CM

## 2017-09-22 DIAGNOSIS — G8929 Other chronic pain: Secondary | ICD-10-CM

## 2017-09-22 MED ORDER — NAPROXEN 500 MG PO TABS
500.0000 mg | ORAL_TABLET | Freq: Two times a day (BID) | ORAL | 1 refills | Status: DC | PRN
Start: 2017-09-22 — End: 2018-01-05

## 2017-09-22 MED FILL — NAPROXEN 500 MG TABLET: 500 | 30 days supply | Qty: 60 | Fill #0

## 2017-09-22 NOTE — Patient Instructions (Signed)
Your pain is due to arthritis. These are the different medications you can take for this: Tylenol  1-2 tabs three times a day for pain. Capsaicin, aspercreme, or biofreeze topically up to four times a day may also help with pain. Some supplements that may help for arthritis: Boswellia extract, curcumin, pycnogenol Naproxen  twice a day with food for inflammation. Cortisone injections are an option. If cortisone injections do not help, there are different types of shots that may help but they take longer to take effect. It's important that you continue to stay active. Straight leg raises, knee extensions 3 sets of 10 once a day (add ankle weight if these become too easy). Consider physical therapy to strengthen muscles around the joint that hurts to take pressure off of the joint itself. Shoe inserts with good arch support may be helpful. Heat or ice 15 minutes at a time 3-4 times a day as needed to help with pain. Water aerobics and cycling with low resistance are the best two types of exercise for arthritis though any exercise is ok as long as it doesn't worsen the pain. Follow up with me in 5-6 weeks but call me sooner if you want to do occupational/physical therapy.

## 2017-09-23 ENCOUNTER — Encounter: Payer: Self-pay | Admitting: Family Medicine

## 2017-09-23 NOTE — Progress Notes (Signed)
Interpreter present for visit.  Reviewed patient's MRI and her MRI report translation from interpreter services.  Evidence that she had edema and probable small fracture of her proximal tibia back in February which has healed at this point.  Also with small medial meniscus tear and underlying arthritis.  Her exam indicates arthritis is her primary issue.  We discussed treatment options - she would like to try medications first.  She also has arthritis bothering her in her hand as we discussed at OV.  Reviewed measures that will help with this also.  F/u in 5-6 weeks.

## 2017-10-11 HISTORY — PX: BREAST EXCISIONAL BIOPSY: SUR124

## 2017-10-27 ENCOUNTER — Ambulatory Visit: Payer: BLUE CROSS/BLUE SHIELD | Admitting: Family Medicine

## 2017-10-29 ENCOUNTER — Encounter: Payer: Self-pay | Admitting: Family Medicine

## 2017-11-03 ENCOUNTER — Ambulatory Visit: Payer: BLUE CROSS/BLUE SHIELD | Admitting: Family Medicine

## 2017-11-05 MED FILL — HYDROCODON-APAP 5-325: 5-325 | 1 days supply | Qty: 10 | Fill #0

## 2017-11-05 MED FILL — IBUPROFEN 800 MG TAB: 800 | 10 days supply | Qty: 30 | Fill #0

## 2017-11-26 MED FILL — NAPROXEN 500 MG TABLET: 500 | 30 days supply | Qty: 60 | Fill #1

## 2018-01-05 ENCOUNTER — Encounter: Payer: Self-pay | Admitting: Family Medicine

## 2018-01-05 ENCOUNTER — Ambulatory Visit (INDEPENDENT_AMBULATORY_CARE_PROVIDER_SITE_OTHER): Payer: BLUE CROSS/BLUE SHIELD | Admitting: Family Medicine

## 2018-01-05 ENCOUNTER — Ambulatory Visit (HOSPITAL_BASED_OUTPATIENT_CLINIC_OR_DEPARTMENT_OTHER)
Admission: RE | Admit: 2018-01-05 | Discharge: 2018-01-05 | Disposition: A | Discharge status: Home / Self Care | Payer: BLUE CROSS/BLUE SHIELD | Source: Ambulatory Visit | Attending: Family Medicine | Admitting: Family Medicine

## 2018-01-05 VITALS — BP 130/65 | HR 63 | Wt 129.0 lb

## 2018-01-05 DIAGNOSIS — M898X6 Other specified disorders of bone, lower leg: Secondary | ICD-10-CM | POA: Diagnosis not present

## 2018-01-05 DIAGNOSIS — M79642 Pain in left hand: Secondary | ICD-10-CM

## 2018-01-05 DIAGNOSIS — M25562 Pain in left knee: Secondary | ICD-10-CM | POA: Diagnosis not present

## 2018-01-05 DIAGNOSIS — M79641 Pain in right hand: Secondary | ICD-10-CM

## 2018-01-05 DIAGNOSIS — M25842 Other specified joint disorders, left hand: Secondary | ICD-10-CM | POA: Diagnosis not present

## 2018-01-05 MED ORDER — NAPROXEN 500 MG PO TABS: 500.0000 mg | ORAL_TABLET | Freq: Two times a day (BID) | ORAL | 3 refills | Status: DC | PRN

## 2018-01-05 MED ORDER — COLCHICINE 0.6 MG PO TABS: 0.6000 mg | ORAL_TABLET | Freq: Every day | ORAL | 2 refills | Status: DC

## 2018-01-05 MED ORDER — DICLOFENAC SODIUM 1 % TD GEL: 4.0000 g | Freq: Four times a day (QID) | TRANSDERMAL | 11 refills | Status: DC

## 2018-01-05 MED FILL — DICLOFENAC SODIUM 1% GEL: 1 | 6 days supply | Qty: 100 | Fill #0

## 2018-01-05 MED FILL — NAPROXEN 500 MG TABLET: 500 | 30 days supply | Qty: 60 | Fill #0

## 2018-01-05 NOTE — Progress Notes (Signed)
Erin Nielsen is a 62 y.o. female who presents to [facility]: Primary Care Sports Medicine today for hand pain.   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 She comes in today complaining of pain in all of her fingers but says the first finger is the worst. She has been taking naproxen which she says provides minimal relief. She says the pain is better when she first wakes up but gets worse throughout the day. She is left handed. The pain is inhibiting her daily function saying that is is especially difficult to cook. She had injections in 2017 which she said helped for a brief time but then the pain returned. She says she is not interested in getting more injections at this time.   Left knee: She also has a history of osteoarthritis. She has a probably healed proximal tibial fracture due to a fall back in February and says that she is having pain in her left knee and leg.  She says this pain has not been improving and is the worst when she walks.   ROS as above:  Exam:  BP 130/65   Pulse 63   Wt 129 lb (58.5 kg)   BMI 22.85 kg/m  Wt Readings from Last 5 Encounters:  01/05/18 129 lb (58.5 kg)  09/22/17 131 lb (59.4 kg)  09/11/17 125 lb (56.7 kg)  12/16/16 129 lb (58.5 kg)  10/29/16 127 lb (57.6 kg)    Gen: Well NAD HEENT: EOMI,  MMM Lungs: Normal work of breathing. CTABL Heart: RRR no MRG Abd: NABS, Soft. Nondistended, Nontender Exts: Brisk capillary refill, warm and well perfused.   Left hand: normal appearing with no obvious deformities.  Tender to palpation over the 1st MCP. No crepitus  Decreased range of motion in 1st MCP and PIP  Left knee: normal appearing with no obvious deformities  Tender to palpation over the medial tibial plateau  Normal ROM  Lab and Radiology Results No results found for this or any previous visit (from the past 72 hour(s)). Dg Knee  1-2 Views Right  Result Date: 01/05/2018 CLINICAL DATA:  Knee pain on the left, 7 months duration. EXAM: RIGHT KNEE - 1-2 VIEW COMPARISON:  None. FINDINGS: Single AP standing view shows normal appearance of the right knee without joint space narrowing. IMPRESSION: Normal one view standing AP view. Electronically Signed   By: Paulina Fusi M.D.   On: 01/05/2018 10:19   Dg Tibia/fibula Left  Result Date: 01/05/2018 CLINICAL DATA:  Lower leg pain, 7-8 months duration. EXAM: LEFT TIBIA AND FIBULA - 2 VIEW COMPARISON:  None. FINDINGS: There is no evidence of fracture or other focal bone lesions. Soft tissues are unremarkable. IMPRESSION: Normal radiographs.  No abnormality seen to explain pain. Electronically Signed   By: Paulina Fusi M.D.   On: 01/05/2018 10:20   Dg Knee Complete 4 Views Left  Result Date: 01/05/2018 CLINICAL DATA:  Left knee and leg pain for the last 7 months. No known injury. EXAM: LEFT KNEE - COMPLETE 4+ VIEW COMPARISON:  None. FINDINGS: No joint space narrowing. No visible effusion. No focal bone finding. IMPRESSION: Normal radiographs. Electronically Signed   By: Paulina Fusi M.D.   On: 01/05/2018 10:18   Dg Hand Complete Left  Result Date: 01/05/2018 CLINICAL DATA:  Chronic index finger MCP joint pain. EXAM: LEFT HAND - COMPLETE 3+ VIEW COMPARISON:  05/25/2015 FINDINGS: The patient has developed numerous small soft  tissue calcifications in the region of the MCP joint of the index finger, not present on the previous study. There is no joint space narrowing. This can be seen with various deposition arthropathies including hydroxy appetite deposition disease or CPPD. There is mild chronic deformity of the metacarpal heads of the middle and ring fingers, with small cysts. IMPRESSION: Development of small soft tissue calcifications in the region of the MCP joint of the index finger. Mild chronic deformity of the metacarpal heads of the long and ring fingers with small cysts. 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 Electronically Signed   By: Paulina FusiMark  Shogry M.D.   On: 01/05/2018 10:26      Assessment and Plan: 62 y.o. female with left hand pain and left knee pain.   Left hand pain: The pain is likely due to progression of her arthritis. The plan will be to get x rays today to assess the degree of degeneration. We will continue to use the naproxen and also will restart her Voltaren gel. She will also begini to see physical therapy for her hand. We will consider repeating injections in the future if the PT does not provide some relief.   X-ray results came back after patient left clinic.  Concerning for pseudogout.  Trial of colchicine.  Recheck 1 month.  Left knee: There is a suspicion that she has a healed proximal tibial fracture 2/2 to her fall in February. The MRI was done in [place] and the report is in [place]. The plan will be to also get an x ray of her left knee and leg. She was advised to also use the Voltaren gel on her knee in the meantime.    Orders Placed This Encounter  Procedures  . DG Knee Complete 4 Views Left    Please include patellar sunrise, lateral, and weightbearing bilateral AP and bilateral rosenberg views    Standing Status:   Future    Number of Occurrences:   1    Standing Expiration Date:   03/07/2019    Order Specific Question:   Reason for exam:    Answer:   Please include patellar sunrise, lateral, and weightbearing bilateral AP and bilateral rosenberg views    Comments:   Please include patellar sunrise, lateral, and weightbearing bilateral AP and bilateral rosenberg views    Order Specific Question:   Preferred imaging location?    Answer[facility]  . DG Knee 1-2 Views Right    Standing Status:   Future    Number of Occurrences:   1    Standing Expiration Date:   03/08/2019    Order Specific Question:   Reason for Exam (SYMPTOM  OR DIAGNOSIS REQUIRED)    Answer:   For use with  the left knee x-ray bilateral AP and Rosenberg standing.    Order Specific Question:   Preferred imaging location?    Answer:   [facility]  . DG Tibia/Fibula Left    Standing Status:   Future    Number of Occurrences:   1    Standing Expiration Date:   03/08/2019    Order Specific Question:   Reason for Exam (SYMPTOM  OR DIAGNOSIS REQUIRED)    Answer:   eval medial tibia pain suspect healed fx    Order Specific Question:   Preferred imaging location?    Answer:   [facility]    Order Specific Question:   Radiology Contrast Protocol - do NOT  remove file path    Answer:   \\[email or web address]s.pdf  . DG Hand Complete Left    Standing Status:   Future    Number of Occurrences:   1    Standing Expiration Date:   03/08/2019    Order Specific Question:   Reason for Exam (SYMPTOM  OR DIAGNOSIS REQUIRED)    Answer:   eval left hand mcp [ain    Order Specific Question:   Preferred imaging location?    Answer:   [facility] Specific Question:   Radiology Contrast Protocol - do NOT remove file path    Answer:   \\[email or web address]s.pdf  . DG Hand Complete Left    Standing Status:   Future    Standing Expiration Date:   03/08/2019    Order Specific Question:   Reason for Exam (SYMPTOM  OR DIAGNOSIS REQUIRED)    Answer:   HAND PAIN    Order Specific Question:   Preferred imaging location?    Answer:   [facility] Specific Question:   Radiology Contrast Protocol - do NOT remove file path    Answer:   \\[email or web address]s.pdf  . DG Hand Complete Right    Standing Status:   Future    Standing Expiration Date:   03/08/2019    Order Specific Question:   Reason for Exam (SYMPTOM  OR DIAGNOSIS REQUIRED)    Answer:   HAND PAIN    Order Specific Question:   Preferred imaging location?    Answer:   [facility] Specific  Question:   Radiology Contrast Protocol - do NOT remove file path    Answer:   \\[email or web address]s.pdf  . Ambulatory referral to Occupational Therapy    Referral Priority:   Routine    Referral Type:   Occupational Therapy    Referral Reason:   Specialty Services Required    Requested Specialty:   Occupational Therapy    Number of Visits Requested:   1   Meds ordered this encounter  Medications  . naproxen (NAPROSYN) 500 MG tablet    Sig: Take 1 tablet (500 mg total) by mouth 2 (two) times daily as needed.    Dispense:  60 tablet    Refill:  3  . diclofenac sodium (VOLTAREN) 1 % GEL    Sig: Apply 4 g topically 4 (four) times daily. To affected joint.    Dispense:  100 g    Refill:  11     Historical information moved to improve visibility of documentation.  Past Medical History:  Diagnosis Date  . Arthritis   . Dizziness   . Hyperlipidemia    Past Surgical History:  Procedure Laterality Date  . COLONOSCOPY N/A 03/30/2015   Procedure: COLONOSCOPY;  Surgeon: Corbin Ade, MD;  [facility];  Service: Endoscopy;  Laterality: N/A;  1430 - pt can't come earlier/lives far away  . ESOPHAGOGASTRODUODENOSCOPY N/A 03/30/2015   Procedure: ESOPHAGOGASTRODUODENOSCOPY (EGD);  Surgeon: Corbin Ade, MD;  [facility]: [facility];  Service: Endoscopy;  Laterality: N/A;   Social History   Tobacco Use  . Smoking status: Never Smoker  . Smokeless tobacco: Never Used  Substance Use Topics  . Alcohol use: No   family history includes Tongue cancer in her brother.  Medications: Current Outpatient Medications  Medication Sig Dispense Refill  . diclofenac sodium (VOLTAREN) 1 % GEL Apply 4 g topically 4 (four) times daily. To affected  joint. 100 g 11  . naproxen (NAPROSYN) 500 MG tablet Take 1 tablet (500 mg total) by mouth 2 (two) times daily as needed. 60 tablet 3  . oxybutynin (DITROPAN-XL) 5 MG 24 hr tablet   3   No current  facility-administered medications for this visit.    Allergies  Allergen Reactions  . Ibuprofen      Discussed warning signs or symptoms. Please see discharge instructions. Patient expresses understanding.  I personally was present and performed or re-performed the history, physical exam and medical decision-making activities of this service and have verified that the service and findings are accurately documented in the student's note. ___________________________________________ Clementeen Graham M.D., ABFM., CAQSM. Primary Care and Sports Medicine Adjunct Instructor of Family Medicine  [employer]

## 2018-01-05 NOTE — Patient Instructions (Signed)
Thank you for coming in today. Attend hand PT.   Get xray of the hands the knee today.  [address], [place], [place] [id number]   Recheck with me in 1 month.   Use naproxen for pain .  Use topical diclofenac gel for pain.

## 2018-01-06 ENCOUNTER — Encounter: Payer: Self-pay | Admitting: Family Medicine

## 2018-01-13 MED FILL — DICLOFENAC SODIUM 1% GEL: 1 | 6 days supply | Qty: 100 | Fill #1

## 2018-01-26 MED FILL — DICLOFENAC SODIUM 1% GEL: 1 | 6 days supply | Qty: 100 | Fill #2

## 2018-02-05 ENCOUNTER — Ambulatory Visit (INDEPENDENT_AMBULATORY_CARE_PROVIDER_SITE_OTHER): Payer: BLUE CROSS/BLUE SHIELD | Admitting: Family Medicine

## 2018-02-05 ENCOUNTER — Encounter: Payer: Self-pay | Admitting: Family Medicine

## 2018-02-05 VITALS — BP 128/71 | HR 72 | Wt 128.0 lb

## 2018-02-05 DIAGNOSIS — M11242 Other chondrocalcinosis, left hand: Secondary | ICD-10-CM | POA: Diagnosis not present

## 2018-02-05 DIAGNOSIS — J029 Acute pharyngitis, unspecified: Secondary | ICD-10-CM | POA: Diagnosis not present

## 2018-02-05 MED ORDER — COLCHICINE 0.6 MG PO TABS: 0.6000 mg | ORAL_TABLET | Freq: Every day | ORAL | 3 refills | Status: DC

## 2018-02-05 MED ORDER — COLCHICINE 0.6 MG PO TABS: 0.6000 mg | ORAL_TABLET | Freq: Every day | ORAL | 12 refills | Status: DC

## 2018-02-05 MED ORDER — DICLOFENAC SODIUM 1 % TD GEL: 4.0000 g | Freq: Four times a day (QID) | TRANSDERMAL | 11 refills | Status: DC

## 2018-02-05 NOTE — Patient Instructions (Addendum)
Thank you for coming in today. Fill out and submit the patient assistance form for the colchicine.  Take naproxen 2x daily as needed for sore throat or fever.  Continue colchicine daily for finger.   Recheck in 3 months.  Return sooner if needed.   Calcium Pyrophosphate Deposition Calcium pyrophosphate deposition (CPPD), which is also called pseudogout, is a type of arthritis that causes pain, swelling, and inflammation in a joint. The joint pain can be severe and may last for days. If it is not treated, the pain may last much longer. Attacks of CPPD may come and go. This condition usually affects one joint at a time. The joints that are affected most commonly are the knees, but this condition can also affect the wrists, elbows, shoulders, or ankles. CPPD is similar to gout. Both conditions result from the buildup of crystals in the joint. However, CPPD is caused by a type of crystal that is different than the crystals that cause gout. What are the causes? This condition is caused by the buildup of calcium pyrophosphate dihydrate crystals in the joint. The reason why this buildup occurs is not known. The condition may be passed down from parent to child (hereditary). What increases the risk? This condition is more likely to develop in people who:  Are over 65 years old.  Have a family history of the condition.  Have had joint replacement surgery.  Have had a recent injury.  Have certain medical conditions, such as hemophilia, ochronosis, amyloidosis, or hormonal disorders.  Have low blood magnesium levels.  What are the signs or symptoms? Symptoms of this condition include:  Pain in a joint. The pain may: ? Be intense and constant. ? Come on quickly. ? Get worse with movement. ? Last from several days to a few weeks.  Redness, swelling, and warmth at the joint.  Stiffness of the joint.  How is this diagnosed? To diagnose this condition, your health care provider will use a  needle to remove fluid from the joint. The fluid will be examined under a microscope to check for the crystals that cause CPPD. You may also have imaging tests, such as:  X-rays.  Ultrasound.  How is this treated? There is no way to remove the crystals from the joint and no way to cure this condition. However, treatment can relieve symptoms and improve joint function. Treatment may include:  Nonsteroidal anti-inflammatory drugs (NSAIDs) to reduce inflammation and pain.  Medicines to help prevent attacks.  Injections of medicine (cortisone) into the joint to reduce pain and swelling.  Physical therapy to improve joint function.  Follow these instructions at home:  Take medicines only as directed by your health care provider.  Rest the affected joints until your symptoms start to go away.  Keep your affected joints raised (elevated) when possible. This will help to reduce swelling.  If directed, apply ice to the affected area: ? Put ice in a plastic bag. ? Place a towel between your skin and the bag. ? Leave the ice on for 20 minutes, 2-3 times per day.  If the painful joint is in your leg, use crutches as directed by your health care provider.  When your symptoms start to go away, begin to exercise regularly or do physical therapy. Talk with your health care provider or physical therapist about what types of exercise are safe for you. Low-impact exercise may be best. This includes walking, swimming, bicycling, and water aerobics.  Maintain a healthy weight so your joints  do not need to bear more weight than necessary. Contact a health care provider if:  You have an increase in joint pain that is not relieved with medicine.  Your joint becomes more red, swollen, or stiff.  You have a fever.  You have a skin rash. This information is not intended to replace advice given to you by your health care provider. Make sure you discuss any questions you have with your health care  provider. Document Released: 01/20/2004 Document Revised: 10/05/2015 Document Reviewed: 04/06/2014 Elsevier Interactive Patient Education  2018 Elsevier Inc.   Pharyngitis Pharyngitis is a sore throat (pharynx). There is redness, pain, and swelling of your throat. Follow these instructions at home:  Drink enough fluids to keep your pee (urine) clear or pale yellow.  Only take medicine as told by your doctor. ? You may get sick again if you do not take medicine as told. Finish your medicines, even if you start to feel better. ? Do not take aspirin.  Rest.  Rinse your mouth (gargle) with salt water ( tsp of salt per 1 qt of water) every 1-2 hours. This will help the pain.  If you are not at risk for choking, you can suck on hard candy or sore throat lozenges. Contact a doctor if:  You have large, tender lumps on your neck.  You have a rash.  You cough up green, yellow-brown, or bloody spit. Get help right away if:  You have a stiff neck.  You drool or cannot swallow liquids.  You throw up (vomit) or are not able to keep medicine or liquids down.  You have very bad pain that does not go away with medicine.  You have problems breathing (not from a stuffy nose). This information is not intended to replace advice given to you by your health care provider. Make sure you discuss any questions you have with your health care provider. Document Released: 10/16/2007 Document Revised: 10/05/2015 Document Reviewed: 01/04/2013 Elsevier Interactive Patient Education  2017 [employer].

## 2018-02-05 NOTE — Progress Notes (Signed)
Erin Nielsen is a 62 y.o. female who presents to [facility]: Primary Care Sports Medicine today for follow-up left hand pain and discuss sore throat.  Erin Nielsen has been seen several times for hand pain.  X-ray recently showed concern for pseudogout changes on left hand second MCP.  She was prescribed colchicine and diclofenac gel.  She notes significant improvement with daily colchicine.  She notes however her insurance does not usually cover it and it is a bit expensive at $60 a month using a good Rx card.  She notes also the diclofenac gel in addition helps a lot.  She notes some continued stiffness but overall is feeling pretty happy with how things are going.  Additionally she notes a new 2-day history of sore throat.  She has pain in the anterior throat worse with swallowing.  She denies any dysphasia.  No fevers chills nausea vomiting or diarrhea.  She is never really tried much treatment yet.  She feels well otherwise.   ROS as above:  Exam:  BP 128/71   Pulse 72   Wt 128 lb (58.1 kg)   BMI 22.67 kg/m  Wt Readings from Last 5 Encounters:  02/05/18 128 lb (58.1 kg)  01/05/18 129 lb (58.5 kg)  09/22/17 131 lb (59.4 kg)  09/11/17 125 lb (56.7 kg)  12/16/16 129 lb (58.5 kg)    Gen: Well NAD HEENT: EOMI,  MMM posterior pharynx is largely normal-appearing.  Mild cervical lymphadenopathy present bilaterally.  Lungs: Normal work of breathing. CTABL Heart: RRR no MRG Abd: NABS, Soft. Nondistended, Nontender Exts: Brisk capillary refill, warm and well perfused.  Left hand: Slightly swollen at second MCP.  Slight decreased flexion range of motion.  No triggering.  Minimally tender.  Lab and Radiology Results No results found for this or any previous visit (from the past 72 hour(s)). No results found.    Assessment and Plan: 62 y.o. female with  Left hand pain likely pseudogout.  Plan for  continued colchicine and diclofenac gel.  Will fill out and submit patient assistance program for manufacture of Colcrys.  In the meantime continue 30-day supply with refills at local pharmacy.  Additionally continue diclofenac gel.  Sore throat: Likely viral pharyngitis.  Recommend naproxen for symptom control.  Recheck if not improving.  Recheck hand in about 3 months.   No orders of the defined types were placed in this encounter.  Meds ordered this encounter  Medications  . DISCONTD: colchicine 0.6 MG tablet    Sig: Take 1 tablet (0.6 mg total) by mouth daily.    Dispense:  30 tablet    Refill:  12  . colchicine 0.6 MG tablet    Sig: Take 1 tablet (0.6 mg total) by mouth daily.    Dispense:  90 tablet    Refill:  3  . diclofenac sodium (VOLTAREN) 1 % GEL    Sig: Apply 4 g topically 4 (four) times daily. To affected joint.    Dispense:  100 g    Refill:  11     Historical information moved to improve visibility of documentation.  Past Medical History:  Diagnosis Date  . Arthritis   . Dizziness   . Hyperlipidemia    Past Surgical History:  Procedure Laterality Date  . COLONOSCOPY N/A 03/30/2015   Procedure: COLONOSCOPY;  Surgeon: Corbin Ade, MD;  [facility];  Service: Endoscopy;  Laterality: N/A;  1430 - pt can't come earlier/lives  far away  . ESOPHAGOGASTRODUODENOSCOPY N/A 03/30/2015   Procedure: ESOPHAGOGASTRODUODENOSCOPY (EGD);  Surgeon: Corbin Ade, MD;  [facility];  Service: Endoscopy;  Laterality: N/A;   Social History   Tobacco Use  . Smoking status: Never Smoker  . Smokeless tobacco: Never Used  Substance Use Topics  . Alcohol use: No   family history includes Tongue cancer in her brother.  Medications: Current Outpatient Medications  Medication Sig Dispense Refill  . colchicine 0.6 MG tablet Take 1 tablet (0.6 mg total) by mouth daily. 90 tablet 3  . diclofenac sodium (VOLTAREN) 1 % GEL Apply 4 g topically 4 (four)  times daily. To affected joint. 100 g 11  . naproxen (NAPROSYN) 500 MG tablet Take 1 tablet (500 mg total) by mouth 2 (two) times daily as needed. 60 tablet 3  . oxybutynin (DITROPAN-XL) 5 MG 24 hr tablet   3   No current facility-administered medications for this visit.    Allergies  Allergen Reactions  . Ibuprofen      Discussed warning signs or symptoms. Please see discharge instructions. Patient expresses understanding.

## 2018-02-06 MED ORDER — COLCHICINE 0.6 MG PO CAPS: 1.0000 | ORAL_CAPSULE | Freq: Every day | ORAL | 1 refills | Status: DC

## 2018-02-06 MED FILL — MITIGARE 0.6 MG CAPSULE: 0.6 | 90 days supply | Qty: 90 | Fill #0

## 2018-02-10 MED FILL — DICLOFENAC SODIUM 1% GEL: 1 | 6 days supply | Qty: 100 | Fill #3

## 2018-02-19 ENCOUNTER — Encounter: Payer: Self-pay | Admitting: Family Medicine

## 2018-02-20 MED ORDER — COLCHICINE 0.6 MG PO CAPS: 1.0000 | ORAL_CAPSULE | Freq: Every day | ORAL | 1 refills | Status: DC

## 2018-02-23 ENCOUNTER — Emergency Department (INDEPENDENT_AMBULATORY_CARE_PROVIDER_SITE_OTHER)
Admission: EM | Admit: 2018-02-23 | Discharge: 2018-02-23 | Disposition: A | Discharge status: Home / Self Care | Payer: BLUE CROSS/BLUE SHIELD | Source: Home / Self Care | Attending: Family Medicine | Admitting: Family Medicine

## 2018-02-23 ENCOUNTER — Encounter: Payer: Self-pay | Admitting: *Deleted

## 2018-02-23 ENCOUNTER — Other Ambulatory Visit: Payer: Self-pay

## 2018-02-23 DIAGNOSIS — G8929 Other chronic pain: Secondary | ICD-10-CM

## 2018-02-23 DIAGNOSIS — Z23 Encounter for immunization: Secondary | ICD-10-CM

## 2018-02-23 DIAGNOSIS — R1013 Epigastric pain: Secondary | ICD-10-CM

## 2018-02-23 MED ORDER — INFLUENZA VAC SPLIT QUAD 0.5 ML IM SUSY
0.5000 mL | PREFILLED_SYRINGE | INTRAMUSCULAR | Status: AC
  Administered 2018-02-23: 0.5 mL via INTRAMUSCULAR

## 2018-02-23 MED ORDER — LANSOPRAZOLE 30 MG PO CPDR: 30.0000 mg | DELAYED_RELEASE_CAPSULE | Freq: Every day | ORAL | 1 refills | Status: DC

## 2018-02-23 NOTE — Discharge Instructions (Signed)
°  Please take your medication as prescribed and be sure to schedule a follow up appointment with a primary care provider within 1 month for recheck of symptoms.

## 2018-02-23 NOTE — ED Triage Notes (Signed)
Pt c/o mid epigastric pain for years, worse x2-3 mths after taking arthritis medication. The pain is worse at night.

## 2018-02-23 NOTE — ED Provider Notes (Signed)
Erin Nielsen C[facility]    CSN: [id number] Arrival date & time: 02/23/18  1823     History   Chief Complaint Chief Complaint  Patient presents with  . Abdominal Pain    HPI Erin Nielsen is a 62 y.o. female.   Language interpreter used throughout visit.   HPI  Erin Nielsen is a 62 y.o. female accompanied by an [place] speaking family member, presenting to UC with c/o chronic epigastric pain that has worsened over the last 2-3 months after being started on medication for her arthritis- naproxen.  She has finished the naproxen but her pain persists, worse with lying down or eating too much, better with applying pressure to her abdomen.  She was seen by at GI specialist several years ago for an endoscopy, dx with a duodenal ulcer. She was prescribed Prevacid. Pain resolved at that time but she has not f/u with a GI specialist since then.  Her PCP recently retired, she is currently looking for a new PCP.      Past Medical History:  Diagnosis Date  . Arthritis   . Dizziness   . Hyperlipidemia     Patient Active Problem List   Diagnosis Date Noted  . Pseudogout of hand, left 02/05/2018  . GERD (gastroesophageal reflux disease) 06/04/2016  . Bilateral knee pain 10/27/2015  . Shoulder pain, left 05/25/2015  . Hand pain, left 05/25/2015  . Gastric ulceration   . Hemorrhoid   . Melanosis coli   . Bradycardia 03/14/2015  . HTN (hypertension) 03/14/2015  . Rectal bleeding 03/09/2015  . Left wrist pain 01/12/2015  . Benign paroxysmal positional vertigo 01/09/2015    Past Surgical History:  Procedure Laterality Date  . COLONOSCOPY N/A 03/30/2015   Procedure: COLONOSCOPY;  Surgeon: Corbin Ade, Nielsen;  Location: [facility];  Service: Endoscopy;  Laterality: N/A;  1430 - pt can't come earlier/lives far away  . ESOPHAGOGASTRODUODENOSCOPY N/A 03/30/2015   Procedure: ESOPHAGOGASTRODUODENOSCOPY (EGD);  Surgeon: Corbin Ade, Nielsen;  Location: [facility];  Service: Endoscopy;   Laterality: N/A;    OB History   None      Home Medications    Prior to Admission medications   Medication Sig Start Date End Date Taking? Authorizing Provider  Colchicine 0.6 MG CAPS Take 1 capsule by mouth daily. 02/20/18  Yes Erin Nielsen  diclofenac sodium (VOLTAREN) 1 % GEL Apply 4 g topically 4 (four) times daily. To affected joint. 02/05/18   Erin Nielsen  lansoprazole (PREVACID) 30 MG capsule Take 1 capsule (30 mg total) by mouth daily at 12 noon. 02/23/18   Lurene Shadow, PA-C  naproxen (NAPROSYN) 500 MG tablet Take 1 tablet (500 mg total) by mouth 2 (two) times daily as needed. 01/05/18   Erin Nielsen  oxybutynin (DITROPAN-XL) 5 MG 24 hr tablet  09/11/17   Provider, Historical, Nielsen    Family History Family History  Problem Relation Age of Onset  . Tongue cancer Brother   . Colon cancer Neg Hx     Social History Social History   Tobacco Use  . Smoking status: Never Smoker  . Smokeless tobacco: Never Used  Substance Use Topics  . Alcohol use: No  . Drug use: No     Allergies   Ibuprofen   Review of Systems Review of Systems  Constitutional: Negative for chills and fever.  Cardiovascular: Negative for chest pain and palpitations.  Gastrointestinal: Positive for abdominal pain. Negative for diarrhea, nausea  and vomiting.  Genitourinary: Negative for dysuria, frequency and hematuria.     Physical Exam Triage Vital Signs ED Triage Vitals  Enc Vitals Group     BP 02/23/18 1842 131/79     Pulse Rate 02/23/18 1842 63     Resp 02/23/18 1842 16     Temp 02/23/18 1842 98.4 F (36.9 C)     Temp Source 02/23/18 1842 Oral     SpO2 02/23/18 1842 98 %     Weight 02/23/18 1843 129 lb (58.5 kg)     Height 02/23/18 1843 5\' 3"  (1.6 m)     Head Circumference --      Peak Flow --      Pain Score 02/23/18 1843 4     Pain Loc --      Pain Edu? --      Excl. in GC? --    No data found.  Updated Vital Signs BP 131/79 (BP Location: Right Arm)    Pulse 63   Temp 98.4 F (36.9 C) (Oral)   Resp 16   Ht 5\' 3"  (1.6 m)   Wt 129 lb (58.5 kg)   SpO2 98%   BMI 22.85 kg/m   Visual Acuity Right Eye Distance:   Left Eye Distance:   Bilateral Distance:    Right Eye Near:   Left Eye Near:    Bilateral Near:     Physical Exam  Constitutional: She is oriented to person, place, and time. She appears well-developed and well-nourished.  Non-toxic appearance. She does not appear ill. No distress.  HENT:  Head: Normocephalic and atraumatic.  Eyes: EOM are normal.  Neck: Normal range of motion.  Cardiovascular: Normal rate and regular rhythm.  Pulmonary/Chest: Effort normal and breath sounds normal.  Abdominal: Soft. Normal appearance. There is tenderness in the epigastric area. There is no rigidity, no rebound, no guarding and no CVA tenderness. No hernia.  Musculoskeletal: Normal range of motion.  Neurological: She is alert and oriented to person, place, and time.  Skin: Skin is warm and dry.  Psychiatric: She has a normal mood and affect. Her behavior is normal.  Nursing note and vitals reviewed.    UC Treatments / Results  Labs (all labs ordered are listed, but only abnormal results are displayed) Labs Reviewed - No data to display  EKG None  Radiology No results found.  Procedures Procedures (including critical care time)  Medications Ordered in UC Medications  Influenza vac split quadrivalent PF (FLUARIX) injection 0.5 mL (0.5 mLs Intramuscular Given 02/23/18 1912)    Initial Impression / Assessment and Plan / UC Course  I have reviewed the triage vital signs and the nursing notes.  Pertinent labs & imaging results that were available during my care of the patient were reviewed by me and considered in my medical decision making (see chart for details).     Hx and exam c/w GERD  No evidence of emergent process taking place at this time. Will refill Prevacid, encouraged f/u with PCP within 2-4 weeks.  Pt and  family member verbalized understanding and agreement with tx plan.  Final Clinical Impressions(s) / UC Diagnoses   Final diagnoses:  Chronic epigastric pain     Discharge Instructions      Please take your medication as prescribed and be sure to schedule a follow up appointment with a primary care provider within 1 month for recheck of symptoms.    ED Prescriptions    Medication Sig Dispense Auth. Provider  lansoprazole (PREVACID) 30 MG capsule Take 1 capsule (30 mg total) by mouth daily at 12 noon. 30 capsule Lurene Shadow, PA-C     Controlled Substance Prescriptions [place] Controlled Substance Registry consulted? Not Applicable   Rolla Plate 02/24/18 1610

## 2018-02-26 MED FILL — DICLOFENAC SODIUM 1% GEL: 1 | 6 days supply | Qty: 100 | Fill #4

## 2018-03-09 ENCOUNTER — Encounter: Payer: Self-pay | Admitting: Family Medicine

## 2018-03-09 ENCOUNTER — Ambulatory Visit (INDEPENDENT_AMBULATORY_CARE_PROVIDER_SITE_OTHER): Payer: BLUE CROSS/BLUE SHIELD | Admitting: Family Medicine

## 2018-03-09 ENCOUNTER — Ambulatory Visit (INDEPENDENT_AMBULATORY_CARE_PROVIDER_SITE_OTHER): Payer: BLUE CROSS/BLUE SHIELD

## 2018-03-09 VITALS — BP 125/71 | HR 73 | Wt 133.0 lb

## 2018-03-09 DIAGNOSIS — H8110 Benign paroxysmal vertigo, unspecified ear: Secondary | ICD-10-CM

## 2018-03-09 DIAGNOSIS — M7711 Lateral epicondylitis, right elbow: Secondary | ICD-10-CM

## 2018-03-09 DIAGNOSIS — M542 Cervicalgia: Secondary | ICD-10-CM

## 2018-03-09 MED ORDER — MECLIZINE HCL 25 MG PO TABS: 25.0000 mg | ORAL_TABLET | Freq: Three times a day (TID) | ORAL | 3 refills | Status: DC | PRN

## 2018-03-09 MED ORDER — LANSOPRAZOLE 30 MG PO CPDR: 30.0000 mg | DELAYED_RELEASE_CAPSULE | Freq: Every day | ORAL | 1 refills | Status: DC

## 2018-03-09 NOTE — Patient Instructions (Signed)
Thank you for coming in today. Use meclizine as needed for dizzy.  Attend physical therapy for neck pain, elbow pain and dizzy.  Recheck with me in 2-4 weeks or sooner if needed.    Benign Positional Vertigo Vertigo is the feeling that you or your surroundings are moving when they are not. Benign positional vertigo is the most common form of vertigo. The cause of this condition is not serious (is benign). This condition is triggered by certain movements and positions (is positional). This condition can be dangerous if it occurs while you are doing something that could endanger you or others, such as driving. What are the causes? In many cases, the cause of this condition is not known. It may be caused by a disturbance in an area of the inner ear that helps your brain to sense movement and balance. This disturbance can be caused by a viral infection (labyrinthitis), head injury, or repetitive motion. What increases the risk? This condition is more likely to develop in:  Women.  People who are 34 years of age or older.  What are the signs or symptoms? Symptoms of this condition usually happen when you move your head or your eyes in different directions. Symptoms may start suddenly, and they usually last for less than a minute. Symptoms may include:  Loss of balance and falling.  Feeling like you are spinning or moving.  Feeling like your surroundings are spinning or moving.  Nausea and vomiting.  Blurred vision.  Dizziness.  Involuntary eye movement (nystagmus).  Symptoms can be mild and cause only slight annoyance, or they can be severe and interfere with daily life. Episodes of benign positional vertigo may return (recur) over time, and they may be triggered by certain movements. Symptoms may improve over time. How is this diagnosed? This condition is usually diagnosed by medical history and a physical exam of the head, neck, and ears. You may be referred to a health care provider  who specializes in ear, nose, and throat (ENT) problems (otolaryngologist) or a provider who specializes in disorders of the nervous system (neurologist). You may have additional testing, including:  MRI.  A CT scan.  Eye movement tests. Your health care provider may ask you to change positions quickly while he or she watches you for symptoms of benign positional vertigo, such as nystagmus. Eye movement may be tested with an electronystagmogram (ENG), caloric stimulation, the Dix-Hallpike test, or the roll test.  An electroencephalogram (EEG). This records electrical activity in your brain.  Hearing tests.  How is this treated? Usually, your health care provider will treat this by moving your head in specific positions to adjust your inner ear back to normal. Surgery may be needed in severe cases, but this is rare. In some cases, benign positional vertigo may resolve on its own in 2-4 weeks. Follow these instructions at home: Safety  Move slowly.Avoid sudden body or head movements.  Avoid driving.  Avoid operating heavy machinery.  Avoid doing any tasks that would be dangerous to you or others if a vertigo episode would occur.  If you have trouble walking or keeping your balance, try using a cane for stability. If you feel dizzy or unstable, sit down right away.  Return to your normal activities as told by your health care provider. Ask your health care provider what activities are safe for you. General instructions  Take over-the-counter and prescription medicines only as told by your health care provider.  Avoid certain positions or movements as  told by your health care provider.  Drink enough fluid to keep your urine clear or pale yellow.  Keep all follow-up visits as told by your health care provider. This is important. Contact a health care provider if:  You have a fever.  Your condition gets worse or you develop new symptoms.  Your family or friends notice any  behavioral changes.  Your nausea or vomiting gets worse.  You have numbness or a "pins and needles" sensation. Get help right away if:  You have difficulty speaking or moving.  You are always dizzy.  You faint.  You develop severe headaches.  You have weakness in your legs or arms.  You have changes in your hearing or vision.  You develop a stiff neck.  You develop sensitivity to light. This information is not intended to replace advice given to you by your health care provider. Make sure you discuss any questions you have with your health care provider. Document Released: 02/04/2006 Document Revised: 10/05/2015 Document Reviewed: 08/22/2014 Elsevier Interactive Patient Education  [employer].

## 2018-03-09 NOTE — Progress Notes (Signed)
Erin Nielsen is a 62 y.o. female who presents to [facility] today for right elbow pain, neck tightness, and dizziness. . She has no known inciting events.  She denies significant weakness or numbness.. She complains of neck tightness at all times and being limited in the ability to move her neck. She would like to have an x-ray of her neck.  She notes pain located in the bilateral lateral neck worse with activity better with rest.  Symptoms ongoing now for a few weeks without injury.  She notes pain in the right lateral elbow.  Pain is worse with wrist extension and motion and better with rest.  No radiating pain weakness or numbness distally.  On Saturday, Erin Nielsen had an episode of dizziness lasting several hours. During this episode, she laid in bed, unable to move or open her eyes without feeling like the room was spinning. She has had these episodes of dizziness ~monthly in the past, but recently this has been occurring every 2-3 days. The dizziness is worse when she turns her head or rolls to the side. She complains of nausea, but denies vomiting. She denies hearing changes, headaches, and falls. She has ringing her in ears all the time. She has taken a medication from [place], Betahistine hydrochloride, which has helped some.    ROS:  As above  Exam:  BP 125/71   Pulse 73   Wt 133 lb (60.3 kg)   BMI 23.56 kg/m  General: Well Developed, well nourished, and in no acute distress.  Neuro/Psych: Alert and oriented x3, extra-ocular muscles intact, able to move all 4 extremities, sensation grossly intact.  Normal balance coordination and gait.  Skin: Warm and dry, no rashes noted.  Respiratory: Not using accessory muscles, speaking in full sentences, trachea midline.  Clear to auscultation bilaterally Cardiovascular: Pulses palpable, no extremity edema.  Regular rate and rhythm with no murmurs rubs or gallops Abdomen: Does not appear  distended. Dix-Hallpike test: Negative bilaterally. MSK:  Cervical spine:  Normal-appearing Nontender spinal midline. Normal C-spine motion some pain with lateral flexion and rotation bilaterally. Upper extremity strength reflexes and sensation are equal and intact throughout.  Right elbow:  Normal-appearing Normal motion. Tender palpation lateral epicondyle. Pain with resisted wrist extension.  Elbow strength normal.    Lab and Radiology Results X-ray C-spine images personally independent reviewed. Some degenerative changes at C6-7 otherwise relatively normal-appearing.  Sub-cervical lordosis in the upper cervical spine indicates spasm. No acute fractures or severe degenerative changes present. Await formal radiology review   Assessment and Plan: 62 y.o. female with  Right elbow pain is lateral epicondylitis.  Plan for eccentric exercises at home stretching and physical therapy.  Neck pain: She is likely having cervical spasm, based on her x-ray. Attend PT. continue home exercise program.  Dizziness: She likely has BPPV, although she had relatively negative Dix-Hallpike exam. Plan for meclizine as needed for dizziness.  We will try to work in some vestibular physical therapy.  Follow-up in 2-4 weeks. At follow-up will also discuss bladder hesitancy. She was on oxybutynin, which was not working for her. Pt would like to consider other options for treatment.    Orders Placed This Encounter  Procedures  . DG Cervical Spine Complete    Standing Status:   Future    Number of Occurrences:   1    Standing Expiration Date:   05/10/2019    Order Specific Question:   Reason for Exam (SYMPTOM  OR  DIAGNOSIS REQUIRED)    Answer:   eval neck pain    Order Specific Question:   Preferred imaging location?    Answer:   [facility]    Order Specific Question:   Radiology Contrast Protocol - do NOT remove file path    Answer:    [email or web address]  . Ambulatory referral to Physical Therapy    Referral Priority:   Routine    Referral Type:   Physical Medicine    Referral Reason:   Specialty Services Required    Requested Specialty:   Physical Therapy   Meds ordered this encounter  Medications  . meclizine (ANTIVERT) 25 MG tablet    Sig: Take 1 tablet (25 mg total) by mouth 3 (three) times daily as needed for dizziness or nausea.    Dispense:  30 tablet    Refill:  3  . lansoprazole (PREVACID) 30 MG capsule    Sig: Take 1 capsule (30 mg total) by mouth daily at 12 noon.    Dispense:  30 capsule    Refill:  1    Historical information moved to improve visibility of documentation.  Past Medical History:  Diagnosis Date  . Arthritis   . Dizziness   . Hyperlipidemia    Past Surgical History:  Procedure Laterality Date  . COLONOSCOPY N/A 03/30/2015   Procedure: COLONOSCOPY;  Surgeon: Corbin Ade, MD;  Location: [facility];  Service: Endoscopy;  Laterality: N/A;  1430 - pt can't come earlier/lives far away  . ESOPHAGOGASTRODUODENOSCOPY N/A 03/30/2015   Procedure: ESOPHAGOGASTRODUODENOSCOPY (EGD);  Surgeon: Corbin Ade, MD;  Location: [facility];  Service: Endoscopy;  Laterality: N/A;   Social History   Tobacco Use  . Smoking status: Never Smoker  . Smokeless tobacco: Never Used  Substance Use Topics  . Alcohol use: No   family history includes Tongue cancer in her brother.  Medications: Current Outpatient Medications  Medication Sig Dispense Refill  . Colchicine 0.6 MG CAPS Take 1 capsule by mouth daily. 90 capsule 1  . diclofenac sodium (VOLTAREN) 1 % GEL Apply 4 g topically 4 (four) times daily. To affected joint. 100 g 11  . lansoprazole (PREVACID) 30 MG capsule Take 1 capsule (30 mg total) by mouth daily at 12 noon. 30 capsule 1  . meclizine (ANTIVERT) 25 MG tablet Take 1 tablet (25 mg total) by mouth 3 (three) times daily as needed for  dizziness or nausea. 30 tablet 3   No current facility-administered medications for this visit.    Allergies  Allergen Reactions  . Ibuprofen       Discussed warning signs or symptoms. Please see discharge instructions. Patient expresses understanding.  I personally was present and performed or re-performed the history, physical exam and medical decision-making activities of this service and have verified that the service and findings are accurately documented in the student's note. ___________________________________________ Clementeen Graham M.D., ABFM., CAQSM. Primary Care and Sports Medicine Adjunct Instructor of Family Medicine  [employer]

## 2018-03-12 ENCOUNTER — Encounter: Payer: Self-pay | Admitting: Rehabilitative and Restorative Service Providers"

## 2018-03-12 ENCOUNTER — Ambulatory Visit: Payer: BLUE CROSS/BLUE SHIELD | Admitting: Rehabilitative and Restorative Service Providers"

## 2018-03-12 DIAGNOSIS — R29898 Other symptoms and signs involving the musculoskeletal system: Secondary | ICD-10-CM | POA: Diagnosis not present

## 2018-03-12 DIAGNOSIS — R42 Dizziness and giddiness: Secondary | ICD-10-CM

## 2018-03-12 DIAGNOSIS — R293 Abnormal posture: Secondary | ICD-10-CM | POA: Diagnosis not present

## 2018-03-12 DIAGNOSIS — M542 Cervicalgia: Secondary | ICD-10-CM | POA: Diagnosis not present

## 2018-03-12 NOTE — Therapy (Signed)
[facility] [place] [address] [place] [address] [place], [place], [address] Phone: [phone]   Fax:  [phone]  Physical Therapy Evaluation  Patient Details  Name: Erin Nielsen MRN: [id number] Date of Birth: Apr 01, 1956 Referring Provider (PT): Dr Clementeen Graham    Encounter Date: 03/12/2018  PT End of Session - 03/12/18 1333    Visit Number  1    Number of Visits  12    Date for PT Re-Evaluation  04/23/18    PT Start Time  0930    PT Stop Time  1034    PT Time Calculation (min)  64 min    Activity Tolerance  Patient tolerated treatment well       Past Medical History:  Diagnosis Date  . Arthritis   . Dizziness   . Hyperlipidemia     Past Surgical History:  Procedure Laterality Date  . COLONOSCOPY N/A 03/30/2015   Procedure: COLONOSCOPY;  Surgeon: Corbin Ade, MD;  [facility];  Service: Endoscopy;  Laterality: N/A;  1430 - pt can't come earlier/lives far away  . ESOPHAGOGASTRODUODENOSCOPY N/A 03/30/2015   Procedure: ESOPHAGOGASTRODUODENOSCOPY (EGD);  Surgeon: Corbin Ade, MD;  [facility];  Service: Endoscopy;  Laterality: N/A;    There were no vitals filed for this visit.   Subjective Assessment - 03/12/18 0939    Subjective  Patient reports that she has had pain in the neck for the past several years with recently symptoms increased in the past several 6 months. The vertigo has also been present for several years and is increased in the past 6 months with incresad frequency of episodes.  She has pain in the Rt elbow and was unable to move her arm and hand after lying on the Lt side overnight - occured 4 days ago - symptoms are improving. She has some soreness in the joint. Patient also has pain in the Lt thumb and index finger which has been present for the past week. Index finger is stiff and moving is difficult. Present for the past two years. All Lt hand hurt and are sore.     Pertinent History  Fx Lt  leg 2018    Diagnostic tests  xrays    Patient Stated Goals  get rid of pain and dizziness     Currently in Pain?  Yes    Pain Score  3    at night 8/10 to 9/10    Pain Location  Neck    Pain Descriptors / Indicators  Aching;Tightness    Pain Onset  More than a month ago    Aggravating Factors   lying down to sleep; turns head; looking up or down     Pain Relieving Factors  holding head straight and still          [facility] PT Assessment - 03/12/18 0001      Assessment   Medical Diagnosis  Cervical dysfunction; Rt lateral epicondylitis; BPPV; Lt hand pain     Referring Provider (PT)  Dr Clementeen Graham     Onset Date/Surgical Date  08/11/17   flare up 6 months ago - problems are chronic in nature    Hand Dominance  Left    Next MD Visit  6 weeks     Prior Therapy  in [place] > 12 yrs ago       Precautions   Precautions  None      Balance Screen   Has the patient fallen in  the past 6 months  No    Has the patient had a decrease in activity level because of a fear of falling?   No    Is the patient reluctant to leave their home because of a fear of falling?   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     Leisure  cares for grandchildren       Observation/Other Assessments   Focus on Therapeutic Outcomes (FOTO)   52% limitation       Sensation   Additional Comments  WFL's       Posture/Postural Control   Posture Comments  head forward; shoulders rounded and elevated       AROM   Right/Left Shoulder  --   end range tightness bilat shd elevation    Cervical Flexion  40    Cervical Extension  38    Cervical - Right Side Bend  44    Cervical - Left Side Bend  26    Cervical - Right Rotation  50    Cervical - Left Rotation  53       Strength   Overall Strength Comments  5/5 bilat UE's       Palpation   Spinal mobility  hypomobile and tender with CPA mobs through upper thoracic and cervical spine     Palpation comment  muscular tightness ant/lat/posterior cervical muscuature; pecs; upper trap; leveator; teres bilat            Vestibular Assessment - 03/12/18 0001      Symptom Behavior   Type of Dizziness  Spinning    Frequency of Dizziness  pt reports dizziness every day - most noticable when she lies down at night     Aggravating Factors  Looking up to the ceiling;Lying supine    Relieving Factors  Closing eyes;Comments   lying on Lt side      Occulomotor Exam   Occulomotor Alignment  Normal    Spontaneous  Absent    Gaze-induced  Absent    Smooth Pursuits  Intact      Vestibulo-Occular Reflex   VOR 1 Head Only (x 1 viewing)  WNL's    Comment  HIT (-) bilat       Positional Testing   Dix-Hallpike  Dix-Hallpike Right;Dix-Hallpike Left      Dix-Hallpike Right   Dix-Hallpike Right Duration  none    Dix-Hallpike Right Symptoms  No nystagmus      Dix-Hallpike Left   Dix-Hallpike Left Duration  none     Dix-Hallpike Left Symptoms  No nystagmus          Objective measurements completed on examination: See above findings.      [facility] Adult PT Treatment/Exercise - 03/12/18 0001      Shoulder Exercises: Standing   Other Standing Exercises  scap squeeze 10 sec x 5; L's x10; W's x 10; axial extension 10 sec x 5      Moist Heat Therapy   Number Minutes Moist Heat  20 Minutes    Moist Heat Location  Cervical   thoracic      Electrical Stimulation   Electrical Stimulation Location  bilat cervical     Electrical Stimulation Action  IFC    Electrical Stimulation Parameters  to tolerance    Electrical Stimulation Goals  Pain;Tone             PT Education - 03/12/18 1018    Education Details  HEP     Person(s) Educated  Patient    Methods  Explanation;Demonstration;Tactile cues;Verbal  cues;Handout    Comprehension  Verbalized understanding;Returned demonstration;Verbal cues required;Tactile cues required          PT Long Term Goals - 03/12/18 1347      PT LONG TERM GOAL #1   Title  Decrease episodes of dizziness per pt report with episodes occuring 50-75% less frequently 04/23/18    Time  6    Period  Weeks    Status  New      PT LONG TERM GOAL #2   Title  Increase cervical mobility/ROM by 5-7 degrees in all planes with minimal to no pain reported with cervical ROM 04/23/18    Time  6    Period  Weeks    Status  New      PT LONG TERM GOAL #3   Title  Patient to report 50-75% decrease in cervical pain/dysfunction 04/23/18    Time  6    Period  Weeks    Status  New      PT LONG TERM GOAL #4   Title  Independent in HEP 04/23/18    Time  6    Period  Weeks    Status  New      PT LONG TERM GOAL #5   Title  Improve FOTO to </= 44% limitation 04/23/18    Time  6    Period  Weeks    Status  New             Plan - 03/12/18 1334    Clinical Impression Statement  Patient presents with c/o of several years of neck pain and dizziness which has increased in the past 6 months. She has Rt elbow pain and Lt hand pain(primarily thumb and index finger) which has been present for several months as well. Patient reports that she takes "[place] medicine" for her dizziness but has not taken the medication Dr Denyse Amass prescribed for her yesterday. She has dizziness several times per week, most commonly at night when she lies down. Symptoms are better when she closes her eyes and when she is lying on her Lt side. She has cervvical pain with any head/neck movement with symptoms worse when she awakens in the morning and in the evening. She has some tinnitus. Patient has limited cervical and UE mobilty and poor posture. She has muscular tightness to palpation through the cervical area; pain which is chronic in nature. Patient will benefit form further evaluation and treatment of  the presenting symptoms.     Clinical Presentation  Evolving    Clinical Decision Making  Moderate    Rehab Potential  Good    PT Frequency  2x / week    PT Duration  6 weeks    PT Treatment/Interventions  Patient/family education;ADLs/Self Care Home Management;Cryotherapy;Electrical Stimulation;Iontophoresis 4mg /ml Dexamethasone;Moist Heat;Traction;Ultrasound;Dry needling;Manual techniques;Neuromuscular re-education;Therapeutic activities;Therapeutic exercise;Vestibular    PT Next Visit Plan  further evaluation of dizziness; Rt lateral epicondylitis and Lt hand pain as indicated; appropriate treatment for BPPV as needed/dizziness; manual work through the cervical area; stretching; strengthening; postural strengthening; modalities as indicated     Consulted and Agree  with Plan of Care  Patient       Patient will benefit from skilled therapeutic intervention in order to improve the following deficits and impairments:  Postural dysfunction, Improper body mechanics, Pain, Increased fascial restricitons, Increased muscle spasms, Decreased range of motion, Decreased mobility, Dizziness  Visit Diagnosis: Cervicalgia - Plan: PT plan of care cert/re-cert  Dizziness and giddiness - Plan: PT plan of care cert/re-cert  Other symptoms and signs involving the musculoskeletal system - Plan: PT plan of care cert/re-cert  Abnormal posture - Plan: PT plan of care cert/re-cert     Problem List Patient Active Problem List   Diagnosis Date Noted  . Pseudogout of hand, left 02/05/2018  . GERD (gastroesophageal reflux disease) 06/04/2016  . Bilateral knee pain 10/27/2015  . Shoulder pain, left 05/25/2015  . Hand pain, left 05/25/2015  . Gastric ulceration   . Hemorrhoid   . Melanosis coli   . Bradycardia 03/14/2015  . HTN (hypertension) 03/14/2015  . Rectal bleeding 03/09/2015  . Left wrist pain 01/12/2015  . Benign paroxysmal positional vertigo 01/09/2015    Zaylee Cornia Rober Minion PT, MPH  03/12/2018,  2:04 PM  [facility] [address] [place], [place], [address] Phone: [phone]   Fax:  [phone]  Name: Obdulia Steier MRN: [id number] Date of Birth: [birth year]

## 2018-03-12 NOTE — Patient Instructions (Signed)
Axial Extension (Chin Tuck)    Pull chin in and lengthen back of neck. Hold __5__ seconds while counting out loud. Repeat __10__ times. Do __several__ sessions per day.  Shoulder Blade Squeeze    Rotate shoulders back, then squeeze shoulder blades down and back. Hold 10 sec Repeat __10__ times. Do __3-4__ sessions per day.  Upper Back Strength: Lower Trapezius / Rotator Cuff " L's "     Arms in waitress pose, palms up. Press hands back and slide shoulder blades down. Hold for __5__ seconds. Repeat _10___ times. 1-2 times per day.    Scapular Retraction: Elbow Flexion (Standing)  "W's"     With elbows bent to 90, pinch shoulder blades together and rotate arms out, keeping elbows bent. Repeat __10__ times per set. Do __1-2__ sets per session. Do _several ___ sessions per day.     [facility] [address] [place], [place] [address]  [phone] (office) [phone] (fax)

## 2018-03-18 ENCOUNTER — Encounter: Payer: Self-pay | Admitting: Physical Therapy

## 2018-03-18 ENCOUNTER — Ambulatory Visit: Payer: BLUE CROSS/BLUE SHIELD | Admitting: Physical Therapy

## 2018-03-18 DIAGNOSIS — R29898 Other symptoms and signs involving the musculoskeletal system: Secondary | ICD-10-CM

## 2018-03-18 DIAGNOSIS — M542 Cervicalgia: Secondary | ICD-10-CM | POA: Diagnosis not present

## 2018-03-18 DIAGNOSIS — R293 Abnormal posture: Secondary | ICD-10-CM | POA: Diagnosis not present

## 2018-03-18 DIAGNOSIS — R42 Dizziness and giddiness: Secondary | ICD-10-CM

## 2018-03-18 NOTE — Therapy (Signed)
[facility] [place] [address] [place] [address] [place], [place], [address] Phone: [phone]   Fax:  [phone]  Physical Therapy Treatment  Patient Details  Name: Erin Nielsen MRN: [id number] Date of Birth: Oct 19, 1955 Referring Provider (PT): Dr Clementeen Graham    Encounter Date: 03/18/2018  PT End of Session - 03/18/18 0928    Visit Number  2    Number of Visits  12    Date for PT Re-Evaluation  04/23/18    PT Start Time  0845    PT Stop Time  0941    PT Time Calculation (min)  56 min    Activity Tolerance  Patient tolerated treatment well       Past Medical History:  Diagnosis Date  . Arthritis   . Dizziness   . Hyperlipidemia     Past Surgical History:  Procedure Laterality Date  . COLONOSCOPY N/A 03/30/2015   Procedure: COLONOSCOPY;  Surgeon: Corbin Ade, MD;  [facility];  Service: Endoscopy;  Laterality: N/A;  1430 - pt can't come earlier/lives far away  . ESOPHAGOGASTRODUODENOSCOPY N/A 03/30/2015   Procedure: ESOPHAGOGASTRODUODENOSCOPY (EGD);  Surgeon: Corbin Ade, MD;  [facility];  Service: Endoscopy;  Laterality: N/A;    There were no vitals filed for this visit.  Subjective Assessment - 03/18/18 0843    Subjective  has felt better over the past couple of days; neck is much better with sleeping.  still having some spinning and vertigo.  spinning occurs in evening when getting ready to sleep, and middle of night, and early AM  spinning lasts for "several hours."    Patient Stated Goals  get rid of pain and dizziness     Pain Score  0-No pain             Vestibular Assessment - 03/18/18 0848      Symptom Behavior   Type of Dizziness  Spinning    Duration of Dizziness  "several hours"      Positional Testing   Dix-Hallpike  Dix-Hallpike Right;Dix-Hallpike Left    Horizontal Canal Testing  Horizontal Canal Right;Horizontal Canal Left      Dix-Hallpike Right   Dix-Hallpike Right  Duration  none    Dix-Hallpike Right Symptoms  No nystagmus      Dix-Hallpike Left   Dix-Hallpike Left Duration  none    Dix-Hallpike Left Symptoms  No nystagmus      Horizontal Canal Right   Horizontal Canal Right Duration  3-5 sec    Horizontal Canal Right Symptoms  Normal      Horizontal Canal Left   Horizontal Canal Left Duration  none    Horizontal Canal Left Symptoms  Normal               [facility] Adult PT Treatment/Exercise - 03/18/18 0001      Self-Care   Self-Care  Other Self-Care Comments    Other Self-Care Comments   educated on posture and proper positioning of books and tablets to decrease pain in neck; discussed causes of vertigo and possible Meniere's disease (rec trying low Na+ diet to see if symptoms improve, but feel Meniere's disease unlikely)      Moist Heat Therapy   Number Minutes Moist Heat  15 Minutes    Moist Heat Location  Cervical      Electrical Stimulation   Electrical Stimulation Location  bilat cervical     Electrical Stimulation Action  IFC    Electrical  Stimulation Parameters  to tolerance x 15 min    Electrical Stimulation Goals  Pain;Tone      Vestibular Treatment/Exercise - 03/18/18 0911      Vestibular Treatment/Exercise   Vestibular Treatment Provided  Canalith Repositioning;Habituation    Canalith Repositioning  Canal Roll Right    Habituation Exercises  Horizontal Roll      Canal Roll Right   Number of Reps   2    Overall Response   Symptoms Resolved      Horizontal Roll   Number of Reps   3    Symptom Description   for HEP instruction; reduction in symptoms            PT Education - 03/18/18 0927    Education Details  habituation, see self care    Person(s) Educated  Patient    Methods  Explanation;Demonstration;Handout    Comprehension  Verbalized understanding;Returned demonstration;Need further instruction          PT Long Term Goals - 03/12/18 1347      PT LONG TERM GOAL #1   Title  Decrease  episodes of dizziness per pt report with episodes occuring 50-75% less frequently 04/23/18    Time  6    Period  Weeks    Status  New      PT LONG TERM GOAL #2   Title  Increase cervical mobility/ROM by 5-7 degrees in all planes with minimal to no pain reported with cervical ROM 04/23/18    Time  6    Period  Weeks    Status  New      PT LONG TERM GOAL #3   Title  Patient to report 50-75% decrease in cervical pain/dysfunction 04/23/18    Time  6    Period  Weeks    Status  New      PT LONG TERM GOAL #4   Title  Independent in HEP 04/23/18    Time  6    Period  Weeks    Status  New      PT LONG TERM GOAL #5   Title  Improve FOTO to </= 44% limitation 04/23/18    Time  6    Period  Weeks    Status  New            Plan - 03/18/18 0347    Clinical Impression Statement  Pt presents today with mild symptoms with horizontal roll to Rt today, so treated with 2 reps of Rt horizontal canalith repositioning maneuver; with resolve on 2nd rep.  Added habituation today to HEP.  Pt progressing well reporting no neck pain with exercises and discussed proper positioning with books and tablets.  Pt's subjective of dizziness includes some consistency with Meniere's disease, but not conclusive.  Recommended trying low Na+ diet to see if symptoms improve.    Rehab Potential  Good    PT Frequency  2x / week    PT Duration  6 weeks    PT Treatment/Interventions  Patient/family education;ADLs/Self Care Home Management;Cryotherapy;Electrical Stimulation;Iontophoresis 4mg /ml Dexamethasone;Moist Heat;Traction;Ultrasound;Dry needling;Manual techniques;Neuromuscular re-education;Therapeutic activities;Therapeutic exercise;Vestibular    PT Next Visit Plan  further evaluation of dizziness; Rt lateral epicondylitis and Lt hand pain as indicated; appropriate treatment for BPPV as needed/dizziness; manual work through the cervical area; stretching; strengthening; postural strengthening; modalities as  indicated     Consulted and Agree with Plan of Care  Patient       Patient will benefit from skilled therapeutic intervention  in order to improve the following deficits and impairments:  Postural dysfunction, Improper body mechanics, Pain, Increased fascial restricitons, Increased muscle spasms, Decreased range of motion, Decreased mobility, Dizziness  Visit Diagnosis: Cervicalgia  Dizziness and giddiness  Other symptoms and signs involving the musculoskeletal system  Abnormal posture     Problem List Patient Active Problem List   Diagnosis Date Noted  . Pseudogout of hand, left 02/05/2018  . GERD (gastroesophageal reflux disease) 06/04/2016  . Bilateral knee pain 10/27/2015  . Shoulder pain, left 05/25/2015  . Hand pain, left 05/25/2015  . Gastric ulceration   . Hemorrhoid   . Melanosis coli   . Bradycardia 03/14/2015  . HTN (hypertension) 03/14/2015  . Rectal bleeding 03/09/2015  . Left wrist pain 01/12/2015  . Benign paroxysmal positional vertigo 01/09/2015      Clarita Crane, PT, DPT 03/18/18 10:18 AM     [facility] [address] [place] [address] [place], [place], [address] Phone: [phone]   Fax:  [phone]  Name: Erin Nielsen MRN: [id number] Date of Birth: May 06, 1956

## 2018-03-18 NOTE — Patient Instructions (Signed)
Access Code: [email or web address]  URL: [email or web address]  Date: 03/18/2018  Prepared by: Moshe Cipro   Exercises  Right Side Lying to Left Side Lying Vestibular Habituation - 3-5 reps - 1 sets - 2x daily - 7x weekly

## 2018-03-20 ENCOUNTER — Ambulatory Visit: Payer: BLUE CROSS/BLUE SHIELD | Admitting: Physical Therapy

## 2018-03-20 ENCOUNTER — Encounter: Payer: Self-pay | Admitting: Physical Therapy

## 2018-03-20 DIAGNOSIS — R42 Dizziness and giddiness: Secondary | ICD-10-CM

## 2018-03-20 DIAGNOSIS — R293 Abnormal posture: Secondary | ICD-10-CM | POA: Diagnosis not present

## 2018-03-20 DIAGNOSIS — M542 Cervicalgia: Secondary | ICD-10-CM | POA: Diagnosis not present

## 2018-03-20 DIAGNOSIS — R29898 Other symptoms and signs involving the musculoskeletal system: Secondary | ICD-10-CM

## 2018-03-20 NOTE — Patient Instructions (Signed)
Access Code: [email or web address]  URL: [email or web address]  Date: 03/20/2018  Prepared by: Moshe Cipro   Exercises  Right Side Lying to Left Side Lying Vestibular Habituation - 3-5 reps - 1 sets - 2x daily - 7x weekly  Seated Gaze Stabilization with Head Nod - 1 sets - 30 Seconds - 2x daily - 7x weekly

## 2018-03-20 NOTE — Therapy (Signed)
[facility] [place], [place], [address] Phone: [phone]   Fax:  [phone]  Physical Therapy Treatment  Patient Details  Name: Erin Nielsen MRN: [id number] Date of Birth: Feb 08, 1956 Referring Provider (PT): Dr Lynne Leader    Encounter Date: 03/20/2018  PT End of Session - 03/20/18 1414    Visit Number  3    Number of Visits  12    Date for PT Re-Evaluation  04/23/18    PT Start Time  1318    PT Stop Time  1420    PT Time Calculation (min)  62 min    Activity Tolerance  Patient tolerated treatment well       Past Medical History:  Diagnosis Date  . Arthritis   . Dizziness   . Hyperlipidemia     Past Surgical History:  Procedure Laterality Date  . COLONOSCOPY N/A 03/30/2015   Procedure: COLONOSCOPY;  Surgeon: Daneil Dolin, MD;  [facility];  Service: Endoscopy;  Laterality: N/A;  1430 - pt can't come earlier/lives far away  . ESOPHAGOGASTRODUODENOSCOPY N/A 03/30/2015   Procedure: ESOPHAGOGASTRODUODENOSCOPY (EGD);  Surgeon: Daneil Dolin, MD;  [facility];  Service: Endoscopy;  Laterality: N/A;    There were no vitals filed for this visit.  Subjective Assessment - 03/20/18 1318    Subjective  got a little dizzy after last session.  didn't happen until closer to 2:00 in the afternoon and lasted until bedtime. didn't take medicine prior to PT last time and thinks that may be part of it.    Pertinent History  Fx Lt leg 2018    Patient Stated Goals  get rid of pain and dizziness     Currently in Pain?  Yes    Pain Score  3    yesterday more painful   Pain Location  Neck    Pain Orientation  Left    Pain Descriptors / Indicators  Aching;Tightness    Pain Onset  More than a month ago    Aggravating Factors   lying down to sleep, turns head, looking up or down    Pain Relieving Factors  holding head straight and still             Vestibular Assessment - 03/20/18  1330      Other Tests   Comments  Cervical Rotation Test: negative               [facility] Adult PT Treatment/Exercise - 03/20/18 1351      Self-Care   Other Self-Care Comments   extended discussion with pt via interpreter about dizziness and neck pain.  Seated Cervical Rotation Test negative indicating cervicogenic dizziness unlikely and vertigo and neck pain are 2 separate impairments.  Pt with difficulty understanding how vestibular system works and that symptoms can be provoked by head motion.        Exercises   Exercises  Neck      Neck Exercises: Seated   Neck Retraction  10 reps;5 secs    Other Seated Exercise  scapular retraction 10 x 5 sec hold      Moist Heat Therapy   Number Minutes Moist Heat  15 Minutes    Moist Heat Location  Cervical      Electrical Stimulation   Electrical Stimulation Location  bilat cervical     Electrical Stimulation Action  IFC    Electrical Stimulation Parameters  to tolerance x 15 min  Electrical Stimulation Goals  Pain;Tone      Manual Therapy   Manual Therapy  Soft tissue mobilization    Manual therapy comments  pt supine    Soft tissue mobilization  bil cervical paraspinals, scalenes, and upper trap      Vestibular Treatment/Exercise - 03/20/18 1348      Vestibular Treatment/Exercise   Vestibular Treatment Provided  Gaze    Gaze Exercises  X1 Viewing Horizontal;X1 Viewing Vertical      X1 Viewing Horizontal   Foot Position  seated    Time  --   30 sec   Reps  1    Comments  increase in sypmtoms 4/10      X1 Viewing Vertical   Foot Position  seated    Time  --   30 sec   Reps  1    Comments  symptoms less intense than horizontal            PT Education - 03/20/18 1413    Education Details  gaze, see self care    Person(s) Educated  Patient    Methods  Explanation;Demonstration;Handout    Comprehension  Verbalized understanding;Returned demonstration;Need further instruction          PT Long Term  Goals - 03/12/18 1347      PT LONG TERM GOAL #1   Title  Decrease episodes of dizziness per pt report with episodes occuring 50-75% less frequently 04/23/18    Time  6    Period  Weeks    Status  New      PT LONG TERM GOAL #2   Title  Increase cervical mobility/ROM by 5-7 degrees in all planes with minimal to no pain reported with cervical ROM 04/23/18    Time  6    Period  Weeks    Status  New      PT LONG TERM GOAL #3   Title  Patient to report 50-75% decrease in cervical pain/dysfunction 04/23/18    Time  6    Period  Weeks    Status  New      PT LONG TERM GOAL #4   Title  Independent in HEP 04/23/18    Time  6    Period  Weeks    Status  New      PT LONG TERM GOAL #5   Title  Improve FOTO to </= 44% limitation 04/23/18    Time  6    Period  Weeks    Status  New            Plan - 03/20/18 1414    Clinical Impression Statement  Pt reports "dizziness" after last session, but with further discussion symptoms seemed most consistent with a headache rather than vertigo.  Cervical rotation test negative today.  No goals met today, and will continue to benefit from PT to maximize function.    Rehab Potential  Good    PT Frequency  2x / week    PT Duration  6 weeks    PT Treatment/Interventions  Patient/family education;ADLs/Self Care Home Management;Cryotherapy;Electrical Stimulation;Iontophoresis 29m/ml Dexamethasone;Moist Heat;Traction;Ultrasound;Dry needling;Manual techniques;Neuromuscular re-education;Therapeutic activities;Therapeutic exercise;Vestibular    PT Next Visit Plan  assess dizziness PRN, continue tx for neck pain    Consulted and Agree with Plan of Care  Patient       Patient will benefit from skilled therapeutic intervention in order to improve the following deficits and impairments:  Postural dysfunction, Improper body mechanics, Pain, Increased fascial  restricitons, Increased muscle spasms, Decreased range of motion, Decreased mobility,  Dizziness  Visit Diagnosis: Cervicalgia  Dizziness and giddiness  Other symptoms and signs involving the musculoskeletal system  Abnormal posture     Problem List Patient Active Problem List   Diagnosis Date Noted  . Pseudogout of hand, left 02/05/2018  . GERD (gastroesophageal reflux disease) 06/04/2016  . Bilateral knee pain 10/27/2015  . Shoulder pain, left 05/25/2015  . Hand pain, left 05/25/2015  . Gastric ulceration   . Hemorrhoid   . Melanosis coli   . Bradycardia 03/14/2015  . HTN (hypertension) 03/14/2015  . Rectal bleeding 03/09/2015  . Left wrist pain 01/12/2015  . Benign paroxysmal positional vertigo 01/09/2015      Laureen Abrahams, PT, DPT 03/20/18 2:24 PM     [facility] [place], [place], [address] Phone: [phone]   Fax:  [phone]  Name: Erin Nielsen MRN: [id number] Date of Birth: Mar 07, 1956

## 2018-03-24 ENCOUNTER — Encounter: Payer: Self-pay | Admitting: Physical Therapy

## 2018-03-24 ENCOUNTER — Ambulatory Visit: Payer: BLUE CROSS/BLUE SHIELD | Admitting: Physical Therapy

## 2018-03-24 DIAGNOSIS — R42 Dizziness and giddiness: Secondary | ICD-10-CM

## 2018-03-24 DIAGNOSIS — M542 Cervicalgia: Secondary | ICD-10-CM

## 2018-03-24 DIAGNOSIS — R29898 Other symptoms and signs involving the musculoskeletal system: Secondary | ICD-10-CM | POA: Diagnosis not present

## 2018-03-24 DIAGNOSIS — R293 Abnormal posture: Secondary | ICD-10-CM

## 2018-03-24 NOTE — Therapy (Signed)
Erin Nielsen Erin Nielsen Erin Nielsen, Erin, Nielsen Phone: [phone]   Fax:  [phone]  Physical Therapy Treatment  Patient Details  Name: Erin Nielsen MRN: [id number] Date of Birth: [birth year] Referring Provider (PT): Dr Erin Nielsen    Encounter Date: 03/24/2018  PT End of Session - 03/24/18 1016    Visit Number  4    Number of Visits  12    Date for PT Re-Evaluation  04/23/18    PT Start Time  0931    PT Stop Time  1012    PT Time Calculation (min)  41 min    Activity Tolerance  Patient tolerated treatment well       Past Medical History:  Diagnosis Date  . Arthritis   . Dizziness   . Hyperlipidemia     Past Surgical History:  Procedure Laterality Date  . COLONOSCOPY N/A 03/30/2015   Procedure: COLONOSCOPY;  Surgeon: Erin Dolin, MD;  [facility];  Service: Endoscopy;  Laterality: N/A;  1430 - pt can't come earlier/lives far away  . ESOPHAGOGASTRODUODENOSCOPY N/A 03/30/2015   Procedure: ESOPHAGOGASTRODUODENOSCOPY (EGD);  Surgeon: Erin Dolin, MD;  [facility];  Service: Endoscopy;  Laterality: N/A;    There were no vitals filed for this visit.  Subjective Assessment - 03/24/18 0932    Subjective  feeling better the past few days.  doing the exercises made her a little dizzy but feeling better.  no more dizziness except when doing the exercise. having pain in her hand, no pain in neck    Pertinent History  Fx Lt leg 2018    Patient Stated Goals  get rid of pain and dizziness     Pain Onset  More than a month ago         [facility] PT Assessment - 03/24/18 0943      Strength   Overall Strength Comments  pain with Rt wrist ext; elbow ext and pronation; strength grossly 5/5                   [facility] Adult PT Treatment/Exercise - 03/24/18 0001      Exercises   Exercises  Wrist;Elbow      Elbow Exercises   Other elbow exercises  tricep stretch 3x30 sec       Wrist Exercises   Other wrist exercises  wrist extensor stretch 3 x 30 sec      Modalities   Modalities  Iontophoresis      Iontophoresis   Type of Iontophoresis  Dexamethasone    Location  Rt tricep    Dose  1.0 cc    Time  6 hour patch      Manual Therapy   Manual Therapy  Soft tissue mobilization    Soft tissue mobilization  IASTM Rt wrist extensors/flexors, triceps/pronator/supinator             PT Education - 03/24/18 1016    Education Details  HEP    Person(s) Educated  Patient    Methods  Explanation;Demonstration;Handout    Comprehension  Verbalized understanding;Returned demonstration          PT Long Term Goals - 03/12/18 1347      PT LONG TERM GOAL #1   Title  Decrease episodes of dizziness per pt report with episodes occuring 50-75% less frequently 04/23/18    Time  6    Period  Weeks    Status  New  PT LONG TERM GOAL #2   Title  Increase cervical mobility/ROM by 5-7 degrees in all planes with minimal to no pain reported with cervical ROM 04/23/18    Time  6    Period  Weeks    Status  New      PT LONG TERM GOAL #3   Title  Patient to report 50-75% decrease in cervical pain/dysfunction 04/23/18    Time  6    Period  Weeks    Status  New      PT LONG TERM GOAL #4   Title  Independent in HEP 04/23/18    Time  6    Period  Weeks    Status  New      PT LONG TERM GOAL #5   Title  Improve FOTO to </= 44% limitation 04/23/18    Time  6    Period  Weeks    Status  New            Plan - 03/24/18 1016    Clinical Impression Statement  Pt presents today reporting dizziness is greatly improved and only having mild symptoms with gaze stabilization.  Complains today of Rt elbow pain and Lt hand pain.  Explained to pt that Lt hand pain is outside scope of referral, but can address Rt elbow pain today.  Pt also felt elbow pain related to dizziness and educated that it's unlikely these two are related.  Pt overall reporting improved neck pain  and dizziness, but not considering goals met until greater carryover noted.  Will continue to benefit from PT to maximize function.    Rehab Potential  Good    PT Frequency  2x / week    PT Duration  6 weeks    PT Treatment/Interventions  Patient/family education;ADLs/Self Care Home Management;Cryotherapy;Electrical Stimulation;Iontophoresis 2m/ml Dexamethasone;Moist Heat;Traction;Ultrasound;Dry needling;Manual techniques;Neuromuscular re-education;Therapeutic activities;Therapeutic exercise;Vestibular    PT Next Visit Plan  assess dizziness PRN, continue tx for neck pain    Consulted and Agree with Plan of Care  Patient       Patient will benefit from skilled therapeutic intervention in order to improve the following deficits and impairments:  Postural dysfunction, Improper body mechanics, Pain, Increased fascial restricitons, Increased muscle spasms, Decreased range of motion, Decreased mobility, Dizziness  Visit Diagnosis: Cervicalgia  Dizziness and giddiness  Other symptoms and signs involving the musculoskeletal system  Abnormal posture     Problem List Patient Active Problem List   Diagnosis Date Noted  . Pseudogout of hand, left 02/05/2018  . GERD (gastroesophageal reflux disease) 06/04/2016  . Bilateral knee pain 10/27/2015  . Shoulder pain, left 05/25/2015  . Hand pain, left 05/25/2015  . Gastric ulceration   . Hemorrhoid   . Melanosis coli   . Bradycardia 03/14/2015  . HTN (hypertension) 03/14/2015  . Rectal bleeding 03/09/2015  . Left wrist pain 01/12/2015  . Benign paroxysmal positional vertigo 01/09/2015      SLaureen Abrahams PT, DPT 03/24/18 10:20 AM    [facility] [address]Phone: [phone]  Fax:  [phone] Name: Erin FredianiMRN: [id number] of Birth: 2December 26, 1957

## 2018-03-24 NOTE — Patient Instructions (Signed)
Access Code: [email or web address]  URL: [email or web address]  Date: 03/24/2018  Prepared by: Moshe Cipro   Exercises  Standing Wrist Flexion Stretch - 3 reps - 1 sets - 30 sec hold - 1x daily - 7x weekly  Standing Overhead Triceps Stretch - 3 reps - 1 sets - 30 sec hold - 1x daily - 7x weekly

## 2018-03-27 ENCOUNTER — Ambulatory Visit: Payer: BLUE CROSS/BLUE SHIELD | Admitting: Physical Therapy

## 2018-03-27 ENCOUNTER — Encounter: Payer: Self-pay | Admitting: Physical Therapy

## 2018-03-27 DIAGNOSIS — R29898 Other symptoms and signs involving the musculoskeletal system: Secondary | ICD-10-CM | POA: Diagnosis not present

## 2018-03-27 DIAGNOSIS — R42 Dizziness and giddiness: Secondary | ICD-10-CM

## 2018-03-27 DIAGNOSIS — R293 Abnormal posture: Secondary | ICD-10-CM | POA: Diagnosis not present

## 2018-03-27 DIAGNOSIS — M542 Cervicalgia: Secondary | ICD-10-CM | POA: Diagnosis not present

## 2018-03-27 NOTE — Therapy (Signed)
Erin Nielsen, [place], [address] Phone: [phone]   Fax:  [phone]  Physical Therapy Treatment  Patient Details  Name: Erin Nielsen: [id number] Date of Birth: 10/23/1955 Referring Provider (PT): Dr Erin Nielsen    Encounter Date: 03/27/2018  PT End of Session - 03/27/18 1019    Visit Number  5    Number of Visits  12    Date for PT Re-Evaluation  04/23/18    PT Start Time  0927    PT Stop Time  1009    PT Time Calculation (min)  42 min    Activity Tolerance  Patient tolerated treatment well       Past Medical History:  Diagnosis Date  . Arthritis   . Dizziness   . Hyperlipidemia     Past Surgical History:  Procedure Laterality Date  . COLONOSCOPY N/A 03/30/2015   Procedure: COLONOSCOPY;  Surgeon: Erin Dolin, MD;  [facility];  Service: Endoscopy;  Laterality: N/A;  1430 - pt can't come earlier/lives far away  . ESOPHAGOGASTRODUODENOSCOPY N/A 03/30/2015   Procedure: ESOPHAGOGASTRODUODENOSCOPY (EGD);  Surgeon: Erin Dolin, MD;  [facility];  Service: Endoscopy;  Laterality: N/A;    There were no vitals filed for this visit.  Subjective Assessment - 03/27/18 0927    Subjective  doing much better.  head and shoulders feel better.  no longer getting dizzy with exercises or daily activities.  still having some Rt elbow pain.  (Pended)     Pertinent History  Fx Lt leg 2018  (Pended)     Patient Stated Goals  get rid of pain and dizziness   (Pended)     Pain Score  1   (Pended)    up to 3/10   Pain Location  Elbow  (Pended)     Pain Orientation  Right  (Pended)     Pain Descriptors / Indicators  Dull;Aching  (Pended)     Pain Onset  More than a month ago  (Pended)     Aggravating Factors   elbow extension in shoulder abduction  (Pended)                        [facility] Adult PT Treatment/Exercise - 03/27/18 1017      Wrist Exercises   Other  wrist exercises  wrist extensor stretch 3 x 30 sec      Modalities   Modalities  Iontophoresis      Iontophoresis   Type of Iontophoresis  Dexamethasone    Location  Rt tricep    Dose  1.0 cc    Time  6 hour patch      Manual Therapy   Manual Therapy  Soft tissue mobilization    Manual therapy comments  pt supine    Soft tissue mobilization  IASTM Rt wrist extensors/flexors, triceps/pronator/supinator       Trigger Point Dry Needling - 03/27/18 1018    Consent Given?  Yes    Education Handout Provided  Yes    Muscles Treated Upper Body  --   wrist ext and triceps: twitch response with incr mm length          PT Education - 03/27/18 1018    Education Details  DN    Person(s) Educated  Patient    Methods  Explanation   handout left in office, will provide next session   Comprehension  Verbalized understanding          PT Long Term Goals - 03/27/18 1019      PT LONG TERM GOAL #1   Title  Decrease episodes of dizziness per pt report with episodes occuring 50-75% less frequently 04/23/18    Time  6    Period  Weeks    Status  Achieved      PT LONG TERM GOAL #2   Title  Increase cervical mobility/ROM by 5-7 degrees in all planes with minimal to no pain reported with cervical ROM 04/23/18    Time  6    Period  Weeks    Status  On-going      PT LONG TERM GOAL #3   Title  Patient to report 50-75% decrease in cervical pain/dysfunction 04/23/18    Time  6    Period  Weeks    Status  Achieved      PT LONG TERM GOAL #4   Title  Independent in HEP 04/23/18    Time  6    Period  Weeks    Status  On-going      PT LONG TERM GOAL #5   Title  Improve FOTO to </= 44% limitation 04/23/18    Time  6    Period  Weeks    Status  On-going            Plan - 03/27/18 1019    Clinical Impression Statement  Pt has met 2 LTGs and is progressing well towards other goals.  Continues to have increased pain in elbow today.  Wants to wait until after she sees MD to  schedule more appts at this time.    Rehab Potential  Good    PT Frequency  2x / week    PT Duration  6 weeks    PT Treatment/Interventions  Patient/family education;ADLs/Self Care Home Management;Cryotherapy;Electrical Stimulation;Iontophoresis 70m/ml Dexamethasone;Moist Heat;Traction;Ultrasound;Dry needling;Manual techniques;Neuromuscular re-education;Therapeutic activities;Therapeutic exercise;Vestibular    PT Next Visit Plan  assess dizziness PRN, continue tx for neck pain; continue for elbow pain     Consulted and Agree with Plan of Care  Patient       Patient will benefit from skilled therapeutic intervention in order to improve the following deficits and impairments:  Postural dysfunction, Improper body mechanics, Pain, Increased fascial restricitons, Increased muscle spasms, Decreased range of motion, Decreased mobility, Dizziness  Visit Diagnosis: Cervicalgia  Dizziness and giddiness  Other symptoms and signs involving the musculoskeletal system  Abnormal posture     Problem List Patient Active Problem List   Diagnosis Date Noted  . Pseudogout of hand, left 02/05/2018  . GERD (gastroesophageal reflux disease) 06/04/2016  . Bilateral knee pain 10/27/2015  . Shoulder pain, left 05/25/2015  . Hand pain, left 05/25/2015  . Gastric ulceration   . Hemorrhoid   . Melanosis coli   . Bradycardia 03/14/2015  . HTN (hypertension) 03/14/2015  . Rectal bleeding 03/09/2015  . Left wrist pain 01/12/2015  . Benign paroxysmal positional vertigo 01/09/2015      Erin Nielsen PT, DPT 03/27/18 10:22 AM     [facility] [address]Phone: [phone]  Fax:  [phone] Name: CRosalin BusterMRN: [id number] of Birth: [birth year]

## 2018-03-27 NOTE — Patient Instructions (Signed)

## 2018-03-31 ENCOUNTER — Encounter: Payer: Self-pay | Admitting: Family Medicine

## 2018-03-31 ENCOUNTER — Ambulatory Visit (INDEPENDENT_AMBULATORY_CARE_PROVIDER_SITE_OTHER): Payer: BLUE CROSS/BLUE SHIELD | Admitting: Family Medicine

## 2018-03-31 VITALS — BP 130/70 | HR 62 | Wt 134.0 lb

## 2018-03-31 DIAGNOSIS — H8110 Benign paroxysmal vertigo, unspecified ear: Secondary | ICD-10-CM

## 2018-03-31 DIAGNOSIS — M11242 Other chondrocalcinosis, left hand: Secondary | ICD-10-CM

## 2018-03-31 DIAGNOSIS — I10 Essential (primary) hypertension: Secondary | ICD-10-CM

## 2018-03-31 DIAGNOSIS — Z124 Encounter for screening for malignant neoplasm of cervix: Secondary | ICD-10-CM

## 2018-03-31 DIAGNOSIS — B977 Papillomavirus as the cause of diseases classified elsewhere: Secondary | ICD-10-CM

## 2018-03-31 DIAGNOSIS — M542 Cervicalgia: Secondary | ICD-10-CM

## 2018-03-31 DIAGNOSIS — M79642 Pain in left hand: Secondary | ICD-10-CM

## 2018-03-31 DIAGNOSIS — M79641 Pain in right hand: Secondary | ICD-10-CM

## 2018-03-31 NOTE — Patient Instructions (Addendum)
Thank you for coming in today. Get fasting labs in the near future.   You should hear from OBGYN.   Recheck with me in January.

## 2018-03-31 NOTE — Progress Notes (Signed)
Erin Nielsen is a 62 y.o. female who [facility] today for follow-up neck pain, elbow pain, vertigo, cervical cancer screening/HPV, and hypertension.  Ms. Erin Nielsen was seen a few weeks ago for neck pain and vertigo.  She is been doing physical therapy and notes that her vertigo has had considerable improvement but she still continues to have neck pain he continues to progress in physical therapy and is happy with how things are going.  She denies any radiating pain weakness or numb  Additionally she was seen with right elbow pain thought to be lateral epicondylitis.  She notes near complete resolution of the symptoms with some physical therapy at home exercise.  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  She is due for recheck.  She knows [place] in the [place] area and wishes to get her GYN care here.  She would like a female provider to do this care if possible.  Additionally she is due for lab recheck following up her hypertension and lipids.  Its been over a year since her last labs.  She notes that [place]. ROS as above:  Exam:  BP 130/70   Pulse 62   Wt 134 lb (60.8 kg)   BMI 23.74 kg/m  Wt Readings from Last 5 Encounters:  03/31/18 134 lb (60.8 kg)  03/09/18 133 lb (60.3 kg)  02/23/18 129 lb (58.5 kg)  02/05/18 128 lb (58.1 kg)  01/05/18 129 lb (58.5 kg)    Gen: Well NAD HEENT: EOMI,  MMM Lungs: Normal work of breathing. CTABL Heart: RRR no MRG Abd: NABS, Soft. Nondistended, Nontender Exts: Brisk capillary refill, warm and well perfused.  C-spine: Nontender to cervical midline.  Tender palpation cervical paraspinal musculature.  Decreased cervical motion. Upper extremity strength is equal normal throughout. Right elbow normal appearing normal motion.  Normal  strength.  Lab and Radiology Results EXAM: CERVICAL SPINE - COMPLETE 4+ VIEW  COMPARISON:  None.  FINDINGS: There is slight reversal of the normal cervical lordosis in the upper cervical spine. The vertebral bodies are preserved in height. The disc space heights are well maintained. The oblique views reveal no high-grade bony encroachment upon the neural foramina. The spinous processes and the odontoid are intact. The prevertebral soft tissue spaces are normal.  IMPRESSION: Mild reversal of the normal cervical lordosis in the upper cervical spine likely reflects muscle spasm. No compression fracture, listhesis, nor significant disc space narrowing.   Electronically Signed   By: David  Swazil[place]Jordan M.D.   On: 03/10/2018 07:20 I personally (independently) visualized and performed the interpretation of the images attached in this note.     Assessment and Plan: 62 y.o. female with  Neck pain: Continued improvement with physical therapy.  Reauthorized physical therapy.    Vertigo resolved with physical therapy.  Right elbow pain resolved with physical therapy  Hypertension: We will obtain basic fasting labs listed below.  Cervical cancer screening/HPV: Due for rescreening.  [place].  Breast cancer screening: We will abstract results from diagnostic mammogram May 2019.  Return in 2 months.   Orders Placed This Encounter  Procedures  . CBC  . COMPLETE METABOLIC PANEL WITH GFR  . Lipid Panel w/reflex Direct LDL  . Ambulatory referral to Obstetrics / Gynecology    Referral Priority:   Routine    Referral Type:  Consultation    Referral Reason:   Specialty Services Required    Requested Specialty:   Obstetrics and Gynecology    Number of Visits Requested:   1  . Ambulatory referral to Physical Therapy    Referral Priority:   Routine    Referral Type:   Physical Medicine    Referral Reason:   Specialty Services Required    Requested  Specialty:   Physical Therapy   No orders of the defined types were placed in this encounter.    Historical information moved to improve visibility of documentation.  Past Medical History:  Diagnosis Date  . Arthritis   . Dizziness   . Hyperlipidemia    Past Surgical History:  Procedure Laterality Date  . COLONOSCOPY N/A 03/30/2015   Procedure: COLONOSCOPY;  Surgeon: Corbin Ade, MD;  [facility];  Service: Endoscopy;  Laterality: N/A;  1430 - pt can't come earlier/lives far away  . ESOPHAGOGASTRODUODENOSCOPY N/A 03/30/2015   Procedure: ESOPHAGOGASTRODUODENOSCOPY (EGD);  Surgeon: Corbin Ade, MD;  [facility];  Service: Endoscopy;  Laterality: N/A;   Social History   Tobacco Use  . Smoking status: Never Smoker  . Smokeless tobacco: Never Used  Substance Use Topics  . Alcohol use: No   family history includes Tongue cancer in her brother.  Medications: Current Outpatient Medications  Medication Sig Dispense Refill  . Colchicine 0.6 MG CAPS Take 1 capsule by mouth daily. 90 capsule 1  . diclofenac sodium (VOLTAREN) 1 % GEL Apply 4 g topically 4 (four) times daily. To affected joint. 100 g 11  . lansoprazole (PREVACID) 30 MG capsule Take 1 capsule (30 mg total) by mouth daily at 12 noon. 30 capsule 1  . meclizine (ANTIVERT) 25 MG tablet Take 1 tablet (25 mg total) by mouth 3 (three) times daily as needed for dizziness or nausea. 30 tablet 3   No current facility-administered medications for this visit.    Allergies  Allergen Reactions  . Ibuprofen      Discussed warning signs or symptoms. Please see discharge instructions. Patient expresses understanding.

## 2018-04-03 ENCOUNTER — Ambulatory Visit (INDEPENDENT_AMBULATORY_CARE_PROVIDER_SITE_OTHER): Payer: BLUE CROSS/BLUE SHIELD | Admitting: Rehabilitative and Restorative Service Providers"

## 2018-04-03 DIAGNOSIS — R293 Abnormal posture: Secondary | ICD-10-CM | POA: Diagnosis not present

## 2018-04-03 DIAGNOSIS — M542 Cervicalgia: Secondary | ICD-10-CM

## 2018-04-03 DIAGNOSIS — R42 Dizziness and giddiness: Secondary | ICD-10-CM | POA: Diagnosis not present

## 2018-04-03 DIAGNOSIS — R29898 Other symptoms and signs involving the musculoskeletal system: Secondary | ICD-10-CM

## 2018-04-03 DIAGNOSIS — M25542 Pain in joints of left hand: Secondary | ICD-10-CM

## 2018-04-03 LAB — COMPLETE METABOLIC PANEL WITH GFR
AG Ratio: 1.4 (calc) (ref 1.0–2.5)
ALBUMIN MSPROF: 4.3 g/dL (ref 3.6–5.1)
ALKALINE PHOSPHATASE (APISO): 54 U/L (ref 33–130)
ALT: 19 U/L (ref 6–29)
AST: 27 U/L (ref 10–35)
BILIRUBIN TOTAL: 0.7 mg/dL (ref 0.2–1.2)
BUN: 9 mg/dL (ref 7–25)
CHLORIDE: 104 mmol/L (ref 98–110)
CO2: 28 mmol/L (ref 20–32)
Calcium: 9.1 mg/dL (ref 8.6–10.4)
Creat: 0.73 mg/dL (ref 0.50–0.99)
GFR, Est African American: 102 mL/min/{1.73_m2} (ref >=60)
GFR, Est Non African American: 88 mL/min/{1.73_m2} (ref >=60)
GLOBULIN: 3 g/dL (ref 1.9–3.7)
Glucose, Bld: 87 mg/dL (ref 65–99)
Potassium: 4.2 mmol/L (ref 3.5–5.3)
SODIUM: 140 mmol/L (ref 135–146)
Total Protein: 7.3 g/dL (ref 6.1–8.1)

## 2018-04-03 LAB — CBC
HCT: 36.6 % (ref 35.0–45.0)
HEMOGLOBIN: 12.3 g/dL (ref 11.7–15.5)
MCH: 29.6 pg (ref 27.0–33.0)
MCHC: 33.6 g/dL (ref 32.0–36.0)
MCV: 88 fL (ref 80.0–100.0)
MPV: 8.5 fL (ref 7.5–12.5)
Platelets: 289 10*3/uL (ref 140–400)
RBC: 4.16 10*6/uL (ref 3.80–5.10)
RDW: 12.1 % (ref 11.0–15.0)
WBC: 7.2 10*3/uL (ref 3.8–10.8)

## 2018-04-03 LAB — LIPID PANEL W/REFLEX DIRECT LDL
CHOLESTEROL: 202 mg/dL — AB (ref <200)
HDL: 47 mg/dL — ABNORMAL LOW (ref >50)
LDL Cholesterol (Calc): 131 mg/dL (calc) — ABNORMAL HIGH
Non-HDL Cholesterol (Calc): 155 mg/dL (calc) — ABNORMAL HIGH (ref <130)
Total CHOL/HDL Ratio: 4.3 (calc) (ref <5.0)
Triglycerides: 129 mg/dL (ref <150)

## 2018-04-03 NOTE — Patient Instructions (Addendum)
Axial Extension (Chin Tuck)    Pull chin in and lengthen back of neck. Hold __5__ seconds while counting out loud. Repeat __10__ times. Do __several__ sessions per day.  Shoulder Blade Squeeze    Rotate shoulders back, then squeeze shoulder blades down and back Hold 10 sec Repeat __10__ times. Do __2-3__ sessions per day.  Upper Back Strength: Lower Trapezius / Rotator Cuff " L's "     Arms in waitress pose, palms up. Press hands back and slide shoulder blades down. Hold for __5__ seconds. Repeat _10___ times. 1-2 times per day.    Scapular Retraction: Elbow Flexion (Standing)  "W's"     With elbows bent to 90, pinch shoulder blades together and rotate arms out, keeping elbows bent. Repeat __10__ times per set. Do __1-2__ sets per session. Do _several ___ sessions per day.  Scapula Adduction With Pectoralis Stretch: Low - Standing   Shoulders at 45 hands even with shoulders, keeping weight through legs, shift weight forward until you feel pull or stretch through the front of your chest. Hold _30__ seconds. Do _3__ times, _2-4__ times per day.   Scapula Adduction With Pectoralis Stretch: Mid-Range - Standing   Shoulders at 90 elbows even with shoulders, keeping weight through legs, shift weight forward until you feel pull or strength through the front of your chest. Hold __30_ seconds. Do _3__ times, __2-4_ times per day.   Scapula Adduction With Pectoralis Stretch: High - Standing   Shoulders at 120 hands up high on the doorway, keeping weight on feet, shift weight forward until you feel pull or stretch through the front of your chest. Hold _30__ seconds. Do _3__ times, _2-3__ times per day.   Triceps, Overhead I    With other hand cupping opposite elbow, bring arm over head and bend elbow as far as possible. Use other hand to gently stretch further. Hold _30__ seconds. Repeat _3__ times per session. Do _3__ sessions per day.   Finger MP  Joints    Using other hand, bend ____index____ finger of left hand at first row of knuckles. Relax. Hold each position _10__ seconds. Repeat __3_ times per session. Do ___ sessions per day.  [facility] [address] [place], [place] [address]  [phone] (office) [phone] (fax)

## 2018-04-03 NOTE — Therapy (Signed)
[place], [place], [address] Phone: [phone]   Fax:  [phone]  Physical Therapy Treatment  Patient Details  Name: Erin Nielsen MRN: [id number] Date of Birth: 1955/10/22 Referring Provider (PT): Dr Lynne Leader    Encounter Date: 04/03/2018  PT End of Session - 04/03/18 1210    Visit Number  6    Number of Visits  12    Date for PT Re-Evaluation  04/23/18    PT Start Time  1100    PT Stop Time  1200    PT Time Calculation (min)  60 min    Activity Tolerance  Patient tolerated treatment well       Past Medical History:  Diagnosis Date  . Arthritis   . Dizziness   . Hyperlipidemia     Past Surgical History:  Procedure Laterality Date  . COLONOSCOPY N/A 03/30/2015   Procedure: COLONOSCOPY;  Surgeon: Daneil Dolin, MD;  [facility];  Service: Endoscopy;  Laterality: N/A;  1430 - pt can't come earlier/lives far away  . ESOPHAGOGASTRODUODENOSCOPY N/A 03/30/2015   Procedure: ESOPHAGOGASTRODUODENOSCOPY (EGD);  Surgeon: Daneil Dolin, MD;  [facility];  Service: Endoscopy;  Laterality: N/A;    There were no vitals filed for this visit.      [facility] PT Assessment - 04/03/18 0001      Assessment   Medical Diagnosis  Cervical dysfunction; Rt lateral epicondylitis; BPPV; Lt/Rt hand pain     Referring Provider (PT)  Dr Lynne Leader     Onset Date/Surgical Date  08/11/17   Lt > Rt hand pain x yrs    Hand Dominance  Left    Next MD Visit  05/01/18    Prior Therapy  in [place] > 12 yrs ago       Precautions   Precautions  None      Balance Screen   Has the patient fallen in the past 6 months  No    Has the patient had a decrease in activity level because of a fear of falling?   No    Is the patient reluctant to leave their home because of a fear of falling?   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     Leisure  cares for grandchildren       Sensation   Additional Comments  WFL's       Posture/Postural Control   Posture Comments  head forward; shoulders rounded and elevated       AROM   Right/Left Finger  --   WFL's except Lt index finger flexion    Left Composite Finger Flexion  50%   index finger stiff w/ limited ROM c/o pain    Right/Left Thumb  --   WFL's bilat    Cervical Flexion  45    Cervical Extension  51    Cervical - Right Side Bend  45    Cervical - Left Side Bend  43    Cervical - Right Rotation  50    Cervical - Left Rotation  52      Strength  Right/Left Wrist  --   WFL's bilat    Right Hand Grip (lbs)  30    Right Hand Lateral Pinch  7 lbs    Left Hand Grip (lbs)  35    Left Hand Lateral Pinch  5.5 lbs      Palpation   Palpation comment  muscular tightness ant/lat/posterior cervical muscuature; pecs; upper trap; leveator; teres bilat; Rt triceps insertion at elbow; Lt MP joint tender to touch                    [facility] Adult PT Treatment/Exercise - 04/03/18 0001      Shoulder Exercises: Standing   Other Standing Exercises  axial extension 10 sec x 5; scap squeeze 10 sec x 5; L's x 10; W's x 10 with noodle       Shoulder Exercises: Stretch   Other Shoulder Stretches  triceps stretch bringing hand overhead 30 sec x 3     Other Shoulder Stretches  3 way doorway stretch       Hand Exercises   MCPJ Flexion  AAROM;Left   index finger w/composite fist 10 sec x 3      Modalities   Modalities  Iontophoresis;Moist Heat;Electrical Stimulation      Moist Heat Therapy   Number Minutes Moist Heat  15 Minutes    Moist Heat Location  Cervical   thoracic      Electrical Stimulation   Electrical Stimulation Location  bilat cervical to thoracic     Electrical Stimulation  Action  IFC    Electrical Stimulation Parameters  to tolerance    Electrical Stimulation Goals  Pain;Tone      Iontophoresis   Type of Iontophoresis  Dexamethasone    Location  Rt tricep; Lt index finger    Dose  1.0 cc    Time  6 hour patch      Manual Therapy   Manual Therapy  Soft tissue mobilization    Manual therapy comments  pt supine    Soft tissue mobilization  ant/lat/post cervical spine musculature; upper traps; thoracic paraspinals; Rt triceps              PT Education - 04/03/18 1128    Education Details  HEP     Person(s) Educated  Patient    Methods  Explanation;Demonstration;Tactile cues;Verbal cues;Handout    Comprehension  Verbalized understanding;Returned demonstration;Verbal cues required;Tactile cues required          PT Long Term Goals - 04/03/18 1219      PT LONG TERM GOAL #1   Title  Decrease episodes of dizziness per pt report with episodes occuring 50-75% less frequently 04/23/18    Time  6    Period  Weeks    Status  Achieved      PT LONG TERM GOAL #2   Title  Increase cervical mobility/ROM by 5-7 degrees in all planes with minimal to no pain reported with cervical ROM 04/23/18    Time  6    Period  Weeks    Status  Partially Met      PT LONG TERM GOAL #3   Title  Patient to report 50-75% decrease in cervical pain/dysfunction 04/23/18    Time  6    Period  Weeks    Status  Partially Met      PT LONG TERM GOAL #4   Title  Independent in HEP 04/23/18    Time  6    Period  Weeks    Status  On-going      PT LONG TERM GOAL #5   Title  Improve FOTO to </= 44% limitation 04/23/18    Time  6    Period  Weeks    Status  On-going      PT LONG TERM GOAL #6   Title  Improve ROM Lt index finger with patient demonstrating composite fist with index finger tip to palm with minimal pain 04/23/18    Time  3    Period  Weeks    Status  New            Plan - 04/03/18 1214    Clinical Impression Statement  Patient presents with c/o  pain in the neck; Rt elbow and Lt index finger. She has limited Lt index finger MP flexion and composite fist. She continues to have tightness through the cervical spine but AROM has increased. She has palpable tightness through the cervical and upper trap area. patient will benefit form continued PT to address problems. Teaching is difficult with language and cultural barriers - even with interpreter.     Rehab Potential  Good    PT Frequency  2x / week    PT Duration  6 weeks    PT Treatment/Interventions  Patient/family education;ADLs/Self Care Home Management;Cryotherapy;Electrical Stimulation;Iontophoresis 29m/ml Dexamethasone;Moist Heat;Traction;Ultrasound;Dry needling;Manual techniques;Neuromuscular re-education;Therapeutic activities;Therapeutic exercise;Vestibular    PT Next Visit Plan  assess dizziness PRN, continue tx for neck pain; continue for elbow pain; continue to adddress cervical pain and Rt elbow/Lt index finger pain    Consulted and Agree with Plan of Care  Patient       Patient will benefit from skilled therapeutic intervention in order to improve the following deficits and impairments:  Postural dysfunction, Improper body mechanics, Pain, Increased fascial restricitons, Increased muscle spasms, Decreased range of motion, Decreased mobility, Dizziness  Visit Diagnosis: Cervicalgia  Dizziness and giddiness  Other symptoms and signs involving the musculoskeletal system  Abnormal posture  Pain in joints of left hand     Problem List Patient Active Problem List   Diagnosis Date Noted  . Pseudogout of hand, left 02/05/2018  . GERD (gastroesophageal reflux disease) 06/04/2016  . Bilateral knee pain 10/27/2015  . Shoulder pain, left 05/25/2015  . Hand pain, left 05/25/2015  . Gastric ulceration   . Hemorrhoid   . Melanosis coli   . Bradycardia 03/14/2015  . HTN (hypertension) 03/14/2015  . Rectal bleeding 03/09/2015  . Left wrist pain 01/12/2015  . Benign  paroxysmal positional vertigo 01/09/2015    Celyn PNilda SimmerPT, MPH  04/03/2018, 12:22 PM  [facility] [address]Phone: [phone]  Fax:  [phone] Name: Erin OmaraMRN: [id number] of Birth: 2Aug 07, 1957

## 2018-04-06 ENCOUNTER — Encounter: Payer: BLUE CROSS/BLUE SHIELD | Admitting: Family Medicine

## 2018-04-07 ENCOUNTER — Ambulatory Visit (INDEPENDENT_AMBULATORY_CARE_PROVIDER_SITE_OTHER): Payer: BLUE CROSS/BLUE SHIELD | Admitting: Physical Therapy

## 2018-04-07 ENCOUNTER — Encounter: Payer: Self-pay | Admitting: Physical Therapy

## 2018-04-07 DIAGNOSIS — R29898 Other symptoms and signs involving the musculoskeletal system: Secondary | ICD-10-CM

## 2018-04-07 DIAGNOSIS — M25542 Pain in joints of left hand: Secondary | ICD-10-CM

## 2018-04-07 DIAGNOSIS — R42 Dizziness and giddiness: Secondary | ICD-10-CM | POA: Diagnosis not present

## 2018-04-07 DIAGNOSIS — R293 Abnormal posture: Secondary | ICD-10-CM | POA: Diagnosis not present

## 2018-04-07 DIAGNOSIS — M542 Cervicalgia: Secondary | ICD-10-CM

## 2018-04-07 NOTE — Therapy (Signed)
[place], [place], [address] Phone: [phone]   Fax:  [phone]  Physical Therapy Treatment  Patient Details  Name: Erin Nielsen MRN: [id number] Date of Birth: [birth year] Referring Provider (PT): Dr Lynne Leader    Encounter Date: 04/07/2018  PT End of Session - 04/07/18 1035    Visit Number  7    Number of Visits  12    Date for PT Re-Evaluation  04/23/18    PT Start Time  0930    PT Stop Time  1012    PT Time Calculation (min)  42 min    Activity Tolerance  Patient tolerated treatment well    Behavior During Therapy  [facility] for tasks assessed/performed       Past Medical History:  Diagnosis Date  . Arthritis   . Dizziness   . Hyperlipidemia     Past Surgical History:  Procedure Laterality Date  . COLONOSCOPY N/A 03/30/2015   Procedure: COLONOSCOPY;  Surgeon: Daneil Dolin, MD;  [facility];  Service: Endoscopy;  Laterality: N/A;  1430 - pt can't come earlier/lives far away  . ESOPHAGOGASTRODUODENOSCOPY N/A 03/30/2015   Procedure: ESOPHAGOGASTRODUODENOSCOPY (EGD);  Surgeon: Daneil Dolin, MD;  [facility];  Service: Endoscopy;  Laterality: N/A;    There were no vitals filed for this visit.  Subjective Assessment - 04/07/18 0930    Subjective  neck hurts and Lt elbow hurts the most.  lying down with legs up made her dizzy.     Patient Stated Goals  get rid of pain and dizziness                        [facility] Adult PT Treatment/Exercise - 04/07/18 1031      Self-Care   Self-Care  Other Self-Care Comments    Other Self-Care Comments   pt stated increased "dizziness" when lying supine with neck extended which was reported as lightheaded and headache.  Feel this is most likely due to BP or circulatory concern, and pt then reported "in [place] my doctor said my blood flow was different."  Advised pt to discuss with MD here      Modalities   Modalities  Iontophoresis;Ultrasound      Ultrasound   Ultrasound Location  lower cervical and Rt elbow    Ultrasound Parameters  1.0 w/cm2, 1 mHz freq, 50% DC x 8 min each location    Ultrasound Goals  Pain      Iontophoresis   Type of Iontophoresis  Dexamethasone    Location  Rt tricep; Lt index finger    Dose  1.0 cc    Time  6 hour patch      Manual Therapy   Manual Therapy  Neural Stretch    Soft tissue mobilization  cervical paraspinals and upper trap; Rt tricep and wrist extensors    Neural Stretch  radial and ulnar nerve                  PT Long Term Goals - 04/03/18 1219      PT LONG TERM GOAL #1   Title  Decrease episodes of dizziness per pt report with episodes occuring 50-75% less frequently 04/23/18    Time  6    Period  Weeks    Status  Achieved      PT LONG TERM GOAL #2   Title  Increase cervical mobility/ROM by 5-7  degrees in all planes with minimal to no pain reported with cervical ROM 04/23/18    Time  6    Period  Weeks    Status  Partially Met      PT LONG TERM GOAL #3   Title  Patient to report 50-75% decrease in cervical pain/dysfunction 04/23/18    Time  6    Period  Weeks    Status  Partially Met      PT LONG TERM GOAL #4   Title  Independent in HEP 04/23/18    Time  6    Period  Weeks    Status  On-going      PT LONG TERM GOAL #5   Title  Improve FOTO to </= 44% limitation 04/23/18    Time  6    Period  Weeks    Status  On-going      PT LONG TERM GOAL #6   Title  Improve ROM Lt index finger with patient demonstrating composite fist with index finger tip to palm with minimal pain 04/23/18    Time  3    Period  Weeks    Status  New            Plan - 04/07/18 1035    Clinical Impression Statement  Pt today requesting ionto to Rt elbow and Lt index finger again as she felt is was helpful.  Neck pain persists, and she stated today that she needs to complete 6 weeks of PT before getting MRI.  Pt also with c/o "dizziness"  which seems to be related to BP or circulatory concern and advised to discuss with MD.  Teaching and education is still difficult even with interpreter, but overall pt reports improved symptoms.    Rehab Potential  Good    PT Frequency  2x / week    PT Duration  6 weeks    PT Treatment/Interventions  Patient/family education;ADLs/Self Care Home Management;Cryotherapy;Electrical Stimulation;Iontophoresis 45m/ml Dexamethasone;Moist Heat;Traction;Ultrasound;Dry needling;Manual techniques;Neuromuscular re-education;Therapeutic activities;Therapeutic exercise;Vestibular    PT Next Visit Plan  assess dizziness PRN, continue tx for neck pain; continue for elbow pain; continue to adddress cervical pain and Rt elbow/Lt index finger pain    Consulted and Agree with Plan of Care  Patient       Patient will benefit from skilled therapeutic intervention in order to improve the following deficits and impairments:  Postural dysfunction, Improper body mechanics, Pain, Increased fascial restricitons, Increased muscle spasms, Decreased range of motion, Decreased mobility, Dizziness  Visit Diagnosis: Cervicalgia  Dizziness and giddiness  Other symptoms and signs involving the musculoskeletal system  Abnormal posture  Pain in joints of left hand     Problem List Patient Active Problem List   Diagnosis Date Noted  . Pseudogout of hand, left 02/05/2018  . GERD (gastroesophageal reflux disease) 06/04/2016  . Bilateral knee pain 10/27/2015  . Shoulder pain, left 05/25/2015  . Hand pain, left 05/25/2015  . Gastric ulceration   . Hemorrhoid   . Melanosis coli   . Bradycardia 03/14/2015  . HTN (hypertension) 03/14/2015  . Rectal bleeding 03/09/2015  . Left wrist pain 01/12/2015  . Benign paroxysmal positional vertigo 01/09/2015      SLaureen Abrahams PT, DPT 04/07/18 10:38 AM     [facility]  [address]Phone: [phone]  Fax:  [phone] Name: Erin CerconeMRN: [id number] of Birth: [birth year]

## 2018-04-14 ENCOUNTER — Ambulatory Visit (INDEPENDENT_AMBULATORY_CARE_PROVIDER_SITE_OTHER): Payer: BLUE CROSS/BLUE SHIELD | Admitting: Physical Therapy

## 2018-04-14 DIAGNOSIS — R293 Abnormal posture: Secondary | ICD-10-CM

## 2018-04-14 DIAGNOSIS — M542 Cervicalgia: Secondary | ICD-10-CM

## 2018-04-14 DIAGNOSIS — M25542 Pain in joints of left hand: Secondary | ICD-10-CM

## 2018-04-14 DIAGNOSIS — R29898 Other symptoms and signs involving the musculoskeletal system: Secondary | ICD-10-CM | POA: Diagnosis not present

## 2018-04-14 NOTE — Therapy (Signed)
[place], [place], [address] Phone: [phone]   Fax:  [phone]  Physical Therapy Treatment  Patient Details  Name: Erin Nielsen MRN: [id number] Date of Birth: 03/28/1956 Referring Provider (PT): Dr Lynne Leader    Encounter Date: 04/14/2018  PT End of Session - 04/14/18 1022    Visit Number  8    Number of Visits  12    Date for PT Re-Evaluation  04/23/18    PT Start Time  1018    PT Stop Time  1101    PT Time Calculation (min)  43 min       Past Medical History:  Diagnosis Date  . Arthritis   . Dizziness   . Hyperlipidemia     Past Surgical History:  Procedure Laterality Date  . COLONOSCOPY N/A 03/30/2015   Procedure: COLONOSCOPY;  Surgeon: Daneil Dolin, MD;  [facility];  Service: Endoscopy;  Laterality: N/A;  1430 - pt can't come earlier/lives far away  . ESOPHAGOGASTRODUODENOSCOPY N/A 03/30/2015   Procedure: ESOPHAGOGASTRODUODENOSCOPY (EGD);  Surgeon: Daneil Dolin, MD;  [facility];  Service: Endoscopy;  Laterality: N/A;    There were no vitals filed for this visit.  Subjective Assessment - 04/14/18 1023    Subjective  Pt complains her neck has been stiff, and she feels like she can not hold her head up.  She has been doing exercises 2x/day.   She still has difficulty cooking due to pain in her elbow and Lt hand.     Patient is accompained by:  Interpreter   Marquita Palms [phone]   Currently in Pain?  Yes    Pain Score  0-No pain   2/ 10 with bending her finger   Pain Location  Finger (Comment which one)    Pain Orientation  Left    Pain Descriptors / Indicators  Sore    Aggravating Factors   bending finger     Pain Relieving Factors  not bending it.     Multiple Pain Sites  Yes    Pain Score  0   up to 4/10 with lifting heavy bowls   Pain Location  Elbow    Pain Orientation  Right    Aggravating Factors   lfiting heavy objects    Pain Relieving Factors   rest         [facility] PT Assessment - 04/14/18 0001      Assessment   Medical Diagnosis  Cervical dysfunction; Rt lateral epicondylitis; BPPV; Lt/Rt hand pain     Referring Provider (PT)  Dr Lynne Leader     Onset Date/Surgical Date  08/11/17   Lt > Rt hand pain x yrs    Hand Dominance  Left    Next MD Visit  05/01/18    Prior Therapy  in [place] > 12 yrs ago        [facility] Adult PT Treatment/Exercise - 04/14/18 0001      Exercises   Exercises  Shoulder      Elbow Exercises   Other elbow exercises  wrist ext stretch x 30 sec x 2 reps each       Shoulder Exercises: Supine   Horizontal ABduction  Strengthening;Both;10 reps;Theraband    Theraband Level (Shoulder Horizontal ABduction)  Level 1 (Yellow)    External Rotation  Strengthening;Both;10 reps;Theraband    Theraband Level (Shoulder External Rotation)  Level 1 (Yellow)    Flexion  Both;10 reps;Theraband  Theraband Level (Shoulder Flexion)  Level 1 (Yellow)    Diagonals  Both;10 reps;Theraband    Theraband Level (Shoulder Diagonals)  Level 1 (Yellow)      Shoulder Exercises: Standing   Other Standing Exercises  axial ext against noodle x 5 sec x 5 reps - cues for proper form.  W's x 5 sec hold x 10 reps (back against noodle)     Shoulder Exercises: Stretch   Other Shoulder Stretches  Rt triceps stretch sliding bent elbow up door frame, 30 sec x 3     Other Shoulder Stretches  3 way doorway stretch  x 20 sec x 2 reps each position. - high position performed unilateral with improved tolerance.       Manual Therapy   Manual Therapy  Soft tissue mobilization;Taping    Soft tissue mobilization  IASTM with edge tool to Rt wrist extensors and tricep to decrease fascial restrictions, pain, and improve ROM.     Kinesiotex  IT sales professional  I strip of sensitive skin Rock tape applied to Rt prox lateral epicondyle to mid tricep with 20% stretch, perpendicular strip applied to distal tricep with 50% stretch;   small I strip applied to dorsal and lateral side of Lt index finger to wrist with no stretch, perpendicular strip on MP joint - to decompress tissue, provide support and reduce pain.    pt educated on safe removal and rationale of tape             PT Education - 04/14/18 1104    Education Details  HEP, kinesiotape info    Person(s) Educated  Other (comment);Patient   thru interpreter   Methods  Explanation;Demonstration;Tactile cues;Verbal cues;Handout    Comprehension  Tactile cues required;Verbal cues required;Returned demonstration;Verbalized understanding          PT Long Term Goals - 04/03/18 1219      PT LONG TERM GOAL #1   Title  Decrease episodes of dizziness per pt report with episodes occuring 50-75% less frequently 04/23/18    Time  6    Period  Weeks    Status  Achieved      PT LONG TERM GOAL #2   Title  Increase cervical mobility/ROM by 5-7 degrees in all planes with minimal to no pain reported with cervical ROM 04/23/18    Time  6    Period  Weeks    Status  Partially Met      PT LONG TERM GOAL #3   Title  Patient to report 50-75% decrease in cervical pain/dysfunction 04/23/18    Time  6    Period  Weeks    Status  Partially Met      PT LONG TERM GOAL #4   Title  Independent in HEP 04/23/18    Time  6    Period  Weeks    Status  On-going      PT LONG TERM GOAL #5   Title  Improve FOTO to </= 44% limitation 04/23/18    Time  6    Period  Weeks    Status  On-going      PT LONG TERM GOAL #6   Title  Improve ROM Lt index finger with patient demonstrating composite fist with index finger tip to palm with minimal pain 04/23/18    Time  3    Period  Weeks    Status  New  Plan - 04/14/18 1104    Clinical Impression Statement  Pt tolerated all exercises well, without increase in pain in neck, finger, or elbow.  Trial of kinesiology tape to Lt index finger and Rt distal tricep, since pt wasn't noticing any difference with ionto patch  to these areas.  Pt gradually progressing towards goals. Continued education needed for posture and not overdoing it with acitivities that agrivate symptoms.     Rehab Potential  Good    PT Frequency  2x / week    PT Duration  6 weeks    PT Treatment/Interventions  Patient/family education;ADLs/Self Care Home Management;Cryotherapy;Electrical Stimulation;Iontophoresis 20m/ml Dexamethasone;Moist Heat;Traction;Ultrasound;Dry needling;Manual techniques;Neuromuscular re-education;Therapeutic activities;Therapeutic exercise;Vestibular    PT Next Visit Plan  continue postural strengthening, addressing pain in Lt finger and Rt elbow.  Assess response to ktape     Consulted and Agree with Plan of Care  Patient   via interpreter.       Patient will benefit from skilled therapeutic intervention in order to improve the following deficits and impairments:  Postural dysfunction, Improper body mechanics, Pain, Increased fascial restricitons, Increased muscle spasms, Decreased range of motion, Decreased mobility, Dizziness  Visit Diagnosis: Cervicalgia  Other symptoms and signs involving the musculoskeletal system  Abnormal posture  Pain in joints of left hand     Problem List Patient Active Problem List   Diagnosis Date Noted  . Pseudogout of hand, left 02/05/2018  . GERD (gastroesophageal reflux disease) 06/04/2016  . Bilateral knee pain 10/27/2015  . Shoulder pain, left 05/25/2015  . Hand pain, left 05/25/2015  . Gastric ulceration   . Hemorrhoid   . Melanosis coli   . Bradycardia 03/14/2015  . HTN (hypertension) 03/14/2015  . Rectal bleeding 03/09/2015  . Left wrist pain 01/12/2015  . Benign paroxysmal positional vertigo 01/09/2015   JKerin Perna PTA 04/14/18 11:25 AM  [place] [address]Phone: [phone]  Fax:  [phone] Name: Erin BaldonadoMRN: [id number] of Birth:  [birth year]

## 2018-04-14 NOTE — Patient Instructions (Signed)
Over Head Pull: Narrow Grip     [place] [phone]   On back, knees bent, feet flat, band across thighs, elbows straight but relaxed. Pull hands apart (start). Keeping elbows straight, bring arms up and over head, hands toward floor. Keep pull steady on band. Hold momentarily. Return slowly, keeping pull steady, back to start. Repeat __10_ times. Band color ___yellow ___   Side Pull: Double Arm   On back, knees bent, feet flat. Arms perpendicular to body, shoulder level, elbows straight but relaxed. Pull arms out to sides, elbows straight. Resistance band comes across collarbones, hands toward floor. Hold momentarily. Slowly return to starting position. Repeat _10__ times. Band color __yellow___   Sash   On back, knees bent, feet flat, left hand on left hip, right hand above left. Pull right arm DIAGONALLY (hip to shoulder) across chest. Bring right arm along head toward floor. Hold momentarily. Slowly return to starting position. Repeat __10_ times. Do with left arm. Band color ___yellow__   Shoulder Rotation: Double Arm   On back, knees bent, feet flat, elbows tucked at sides, bent 90, hands palms up. Pull hands apart and down toward floor, keeping elbows near sides. Hold momentarily. Slowly return to starting position. Repeat _10__ times. Band color __yellow____    

## 2018-04-16 ENCOUNTER — Telehealth: Payer: Self-pay | Admitting: Family Medicine

## 2018-04-16 ENCOUNTER — Encounter: Payer: Self-pay | Admitting: Family Medicine

## 2018-04-16 ENCOUNTER — Ambulatory Visit: Payer: BLUE CROSS/BLUE SHIELD | Attending: Family Medicine | Admitting: Family Medicine

## 2018-04-16 VITALS — BP 143/83 | HR 64 | Temp 98.0°F | Resp 16 | Ht 63.5 in | Wt 132.2 lb

## 2018-04-16 DIAGNOSIS — E785 Hyperlipidemia, unspecified: Secondary | ICD-10-CM | POA: Insufficient documentation

## 2018-04-16 DIAGNOSIS — R51 Headache: Secondary | ICD-10-CM

## 2018-04-16 DIAGNOSIS — Z9889 Other specified postprocedural states: Secondary | ICD-10-CM | POA: Insufficient documentation

## 2018-04-16 DIAGNOSIS — M1A0420 Idiopathic chronic gout, left hand, without tophus (tophi): Secondary | ICD-10-CM

## 2018-04-16 DIAGNOSIS — M7711 Lateral epicondylitis, right elbow: Secondary | ICD-10-CM

## 2018-04-16 DIAGNOSIS — M5412 Radiculopathy, cervical region: Secondary | ICD-10-CM

## 2018-04-16 DIAGNOSIS — G4486 Cervicogenic headache: Secondary | ICD-10-CM

## 2018-04-16 DIAGNOSIS — K219 Gastro-esophageal reflux disease without esophagitis: Secondary | ICD-10-CM | POA: Diagnosis not present

## 2018-04-16 DIAGNOSIS — M62838 Other muscle spasm: Secondary | ICD-10-CM

## 2018-04-16 DIAGNOSIS — R29898 Other symptoms and signs involving the musculoskeletal system: Secondary | ICD-10-CM

## 2018-04-16 DIAGNOSIS — M1A9XX0 Chronic gout, unspecified, without tophus (tophi): Secondary | ICD-10-CM | POA: Insufficient documentation

## 2018-04-16 DIAGNOSIS — M542 Cervicalgia: Secondary | ICD-10-CM

## 2018-04-16 DIAGNOSIS — M4120 Other idiopathic scoliosis, site unspecified: Secondary | ICD-10-CM

## 2018-04-16 MED ORDER — PREDNISONE 20 MG PO TABS: ORAL_TABLET | ORAL | 1 refills | Status: DC

## 2018-04-16 MED ORDER — METHOCARBAMOL 500 MG PO TABS: 500.0000 mg | ORAL_TABLET | Freq: Three times a day (TID) | ORAL | 0 refills | Status: DC | PRN

## 2018-04-16 MED FILL — METHOCARBAMOL 500 MG TABS: 500 | 20 days supply | Qty: 60 | Fill #0

## 2018-04-16 MED FILL — predniSONE 20 MG TABS: 20 | 5 days supply | Qty: 5 | Fill #0

## 2018-04-16 NOTE — Telephone Encounter (Signed)
[facility]  [facility]erv[facility] w/o contrast. Auth # [id number] good from 04/16/2018 to 05/15/2018. KG LPN

## 2018-04-16 NOTE — Telephone Encounter (Signed)
MRI ordered.  Continued neck pain despite trial of conservative management. Patient has had symptoms now greater than 6 weeks and has been doing physician directed therapy for 6 weeks.  She has been attending physical therapy as well. Plan for MRI of C-spine for facet injection planning.

## 2018-04-16 NOTE — Patient Instructions (Signed)
Cervical Radiculopathy  Cervical radiculopathy means that a nerve in the neck is pinched or bruised. This can cause pain or loss of feeling (numbness) that runs from your neck to your arm and fingers.  Follow these instructions at home:  Managing pain  ? Take over-the-counter and prescription medicines only as told by your doctor.  ? If directed, put ice on the injured or painful area.  ? Put ice in a plastic bag.  ? Place a towel between your skin and the bag.  ? Leave the ice on for 20 minutes, 2?3 times per day.  ? If ice does not help, you can try using heat. Take a warm shower or warm bath, or use a heat pack as told by your doctor.  ? You may try a gentle neck and shoulder massage.  Activity  ? Rest as needed. Follow instructions from your doctor about any activities to avoid.  ? Do exercises as told by your doctor or physical therapist.  General instructions  ? If you were given a soft collar, wear it as told by your doctor.  ? Use a flat pillow when you sleep.  ? Keep all follow-up visits as told by your doctor. This is important.  Contact a doctor if:  ? Your condition does not improve with treatment.  Get help right away if:  ? Your pain gets worse and is not controlled with medicine.  ? You lose feeling or feel weak in your hand, arm, face, or leg.  ? You have a fever.  ? You have a stiff neck.  ? You cannot control when you poop or pee (have incontinence).  ? You have trouble with walking, balance, or talking.  This information is not intended to replace advice given to you by your health care provider. Make sure you discuss any questions you have with your health care provider.  Document Released: 04/18/2011 Document Revised: 10/05/2015 Document Reviewed: 06/23/2014  Elsevier Interactive Patient Education ? 2018 [employer]

## 2018-04-16 NOTE — Progress Notes (Signed)
Subjective:    Patient ID: Erin Nielsen, female    DOB: 1955-11-13, 62 y.o.   MRN: [id number]  HPI       62 year old female who was previously seen at this office with last visit on 06/04/2016.  Patient will be considered new because she did see a primary care physician at another practice in follow-up of her hypertension.  Patient has also been followed  by sports medicine for chronic issues with neck pain as well as treatment of trigger finger as well as vertigo which resolved with physical therapy.      At today's visit, patient with complaint of recent increase over the past 3 to 4 days of posterior neck pain as well as headache at the back of the head/scalp.  Patient states that her neck pain has gradually increased over the past 7 to 8 months and when she has onset of neck pain, she also has dizziness and headache.  Patient states that the doctor that she was seen has recommended that she have an MRI however her insurance would not cover an MRI until she first attended physical therapy.  Patient states that she is now participating in physical therapy however she feels that physical therapy has caused her to have more neck pain and dizziness.  Patient states that she will feel okay during the physical therapy but afterwards within an hour or 2 she has increased neck pain and dizziness.        Patient reports that she will feel pain in the back of her neck and head and she feels as if her head is too heavy for her neck to hold up.  Patient with complaint of difficulty sleeping as she cannot find a comfortable position due to the pain in her neck/posterior scalp.  Patient states that when she was in [place] before moving here, she was told that she had blood flow issues in the back of her neck that were causing her to have the neck pain and dizziness.  Patient states that the pain radiates from her neck to her posterior scalp and also radiates across her upper back to both shoulders.  Patient states that  when she initially had onset of pain in her neck she also had some tingling in her hands.  Patient reports that she does have longstanding issues with hand pain secondary to arthritis in her hands as well as issues with gout.  Patient is left-handed.      Patient states that for the past 2 months she has had some sensation of pain radiating down her right arm.  Patient states that she did have an episode x1 where she felt as if her right arm was numb and did not belong to her.  Patient also felt as if she had some weakness in her right arm.  Patient states that when she was evaluated by her doctor, that was when the MRI was suggested and she was then referred to physical therapy.  Patient states that she was having dull, top of the head headaches in addition to headache/pressure in her temples that were off and on but recently, headaches have become constant.  Patient states that previously she had 3-4 headaches per week but now headaches are daily.  Headaches range from a 3-4 on a 0-to-10 scale and can build until they are 7 or 8.  Resting helps the headaches.  Patient states that she has not really taken over-the-counter medications for the headaches.  Patient reports that  she is allergic to ibuprofen which causes her to have abdominal pain and dizziness.  Patient reports that her current medicines consist of colchicine for treatment of gout, Voltaren gel for arthritis pain in her hands, Prevacid for acid reflux symptoms and meclizine to take as needed for dizziness.  Patient reports that she tends to get gout in her left hand and wrist but does not have a current flareup but continues to have pain in the left base of the thumb/wrist.  Patient reports that she has been on blood pressure medication in the past but is not currently taking medication for blood pressure.  Patient believes that her blood pressure may be elevated today due to her neck pain which is a sharp, aching sensation with radiation of sharp pain  to each side of the posterior scalp left greater than right and is currently about a 6 on a 0-to-10 scale.  Past Medical History:  Diagnosis Date  . Arthritis   . Dizziness   . Hyperlipidemia    Past Surgical History:  Procedure Laterality Date  . COLONOSCOPY N/A 03/30/2015   Procedure: COLONOSCOPY;  Surgeon: Corbin Ade, MD;  Location: [facility];  Service: Endoscopy;  Laterality: N/A;  1430 - pt can't come earlier/lives far away  . ESOPHAGOGASTRODUODENOSCOPY N/A 03/30/2015   Procedure: ESOPHAGOGASTRODUODENOSCOPY (EGD);  Surgeon: Corbin Ade, MD;  Location: [facility];  Service: Endoscopy;  Laterality: N/A;   Family History  Problem Relation Age of Onset  . Tongue cancer Brother   . Colon cancer Neg Hx    Social History   Tobacco Use  . Smoking status: Never Smoker  . Smokeless tobacco: Never Used  Substance Use Topics  . Alcohol use: No  . Drug use: No   Allergies  Allergen Reactions  . Ibuprofen        Review of Systems  Constitutional: Positive for fatigue. Negative for chills and fever.  HENT: Negative for sore throat, trouble swallowing and voice change.   Respiratory: Negative for cough and shortness of breath.   Cardiovascular: Negative for chest pain, palpitations and leg swelling.  Gastrointestinal: Negative for abdominal pain, blood in stool, constipation, diarrhea and nausea.  Endocrine: Negative for cold intolerance, heat intolerance, polydipsia, polyphagia and polyuria.  Genitourinary: Negative for dysuria, flank pain and frequency.  Musculoskeletal: Positive for arthralgias, back pain, joint swelling, myalgias, neck pain and neck stiffness. Negative for gait problem.  Allergic/Immunologic: Negative for environmental allergies and food allergies.  Neurological: Positive for dizziness, weakness, light-headedness, numbness and headaches. Negative for syncope and facial asymmetry.  Hematological: Negative for adenopathy. Does not bruise/bleed  easily.       Objective:   Physical Exam BP (!) 143/83   Pulse 64   Temp 98 F (36.7 C) (Oral)   Resp 16   Ht 5' 3.5" (1.613 m)   Wt 132 lb 3.2 oz (60 kg)   SpO2 98%   BMI 23.05 kg/m Nurse's notes and vital signs reviewed General-well-nourished, well-developed older female in no acute distress.  Patient is accompanied by her daughter at today's visit.  Patient's daughter acts as interpreter. ENT-TMs gray, nares with mild edema of the nasal turbinates, patient with mild posterior pharynx/tonsillar arch edema/erythema Neck-supple with exception of bilateral posterior cervical paraspinous spasm which is mild to moderate, patient is able to bend her neck and look from side to side but does have discomfort with doing so and patient with some radicular symptoms when looking to the side opposite of  which she is extending either arm.  No thyromegaly, no carotid bruit no cervical lymphadenopathy noted on exam. Lungs-clear to auscultation bilaterally Cardiovascular-regular rate and rhythm Abdomen-soft, no epigastric tenderness on exam and no other reproducible tenderness on exam Back- patient with posterior cervical and upper back/trapezius area spasm bilaterally and on the left and right, patient with some tenderness at the medial and inferior borders of the scapula.  Patient also has some mild thoracolumbar paraspinous spasm and some tenderness to palpation over the mid cervical spine as well as bilateral posterior occipital ridges where she also has muscle spasm Extremities-no edema Musculoskeletal- patient with tenderness of the lateral epicondyle of the right elbow.  Patient with some mild ulnar drift of the hands/fingers bilaterally and patient with complaint of some discomfort with palpation of the DIP joints and some discomfort making a closed fist.  Patient does have a decrease in grip strength as well as a slight decrease in muscle strength of the right upper extremity as compared to the left.   Patient also with arthritis type changes and some discomfort with palpation and range of motion the left base of the thumb but does not appear to have any acute gouty tophi, no increased warmth or erythema of any joints at today's visit        Assessment & Plan:  1. Cervical radiculopathy On review of chart, patient has had plain films of the cervical spine done 03/09/2018 which showed mild reversal of the normal cervical lordosis in the upper cervical spine likely reflecting muscle spasm but no evidence of compression fracture, listhesis nor significant disc space narrowing.  Patient however reports continued neck pain and has symptoms/exam findings consistent with cervical radiculopathy and patient also with some right-sided arm weakness which requires further evaluation.  I discussed with the patient and the daughter that if she is having increased discomfort with physical therapy then she should contact her doctor's office whom she is seeing for her neck pain and asked that they contact her insurance company and have her physician do a peer review consultation so that MRI can be approved without patient having to complete physical therapy which at this time she reports is making her symptoms worse.  At today's visit, patient is being prescribed prednisone taper and Robaxin for muscle spasm and patient indicates that she will continue follow-up with the sports medicine physician whom she is presently seeing. - predniSONE (DELTASONE) 20 MG tablet; 2 pills today then 1 pill daily x2 days then half pill x2 days, take after eating  Dispense: 5 tablet; Refill: 1 - methocarbamol (ROBAXIN) 500 MG tablet; Take 1 tablet (500 mg total) by mouth every 8 (eight) hours as needed for muscle spasms.  Dispense: 60 tablet; Refill: 0  2. Gastroesophageal reflux disease, esophagitis presence not specified Patient will continue the use of lansoprazole with avoidance of nonsteroidal anti-inflammatories due to her acid  reflux symptoms.  Patient also cautioned that with the use of prednisone she will need to make sure that she eats prior to taking this medication and patient is being given a very short taper due to her history of GERD and increased stomach upset with NSAID use in the past.  3. Other idiopathic scoliosis, unspecified spinal region On exam, patient also has the presence of some scoliosis which is also likely contributing to her issues with muscle spasm and back pain  4. Muscle spasms of neck Discussed the use of warm, moist heat to the areas of muscle spasm and patient  also provided with a very short prednisone taper and prescription for Robaxin - predniSONE (DELTASONE) 20 MG tablet; 2 pills today then 1 pill daily x2 days then half pill x2 days, take after eating  Dispense: 5 tablet; Refill: 1  5. Cervicogenic headache Discussed with the patient that she is likely having cervicogenic headaches related to her posterior neck/scalp muscle spasm as well as possible issues with her cervical spine such as cervical degenerative disc disease.  Discussed the use of warm moist heat to her posterior neck/scalp area.  Patient provided with short course of prednisone and prescription for Robaxin to help with muscle spasm - predniSONE (DELTASONE) 20 MG tablet; 2 pills today then 1 pill daily x2 days then half pill x2 days, take after eating  Dispense: 5 tablet; Refill: 1 - methocarbamol (ROBAXIN) 500 MG tablet; Take 1 tablet (500 mg total) by mouth every 8 (eight) hours as needed for muscle spasms.  Dispense: 60 tablet; Refill: 0  6. Lateral epicondylitis of right elbow Patient is encouraged to inform her sports medicine physician of her tenderness at the right elbow.  Patient should get some improvement with the prescribed prednisone taper but she may also use her Voltaren gel to this area to help with pain.  7. Right arm weakness Patient with complaint of episode of weakness in her right arm.  Patient still  has a very slight deficit in strength on exam but also has right-sided elbow epicondylitis and arthritis in the right hand which could be a contributing factor.  Patient is to discuss her arm weakness with her sports medicine physician as she likely needs a cervical MRI and if the cervical MRI does not reveal a cause for her arm weakness.  Due to the fact that patient has untreated hypertension, patient may also need CT or MRI of the brain to look for any other contributing factors or cause of her arm weakness  8. Chronic gout of left hand, unspecified cause Patient with history of chronic gout of the left hand.  Patient reports that she will be following up with her sports medicine physician.  Patient may need uric acid level and potential daily medication depending on how often she has gout flareups or depending on extent of any arthritic changes/joint damage on x-ray which she may need to have done to further assess her pain related to gout  An After Visit Summary was printed and given to the patient.  Allergies as of 04/16/2018      Reactions   Ibuprofen       Medication List       Accurate as of April 16, 2018 11:59 PM. Always use your most recent med list.        Colchicine 0.6 MG Caps Take 1 capsule by mouth daily.   diclofenac sodium 1 % Gel Commonly known as:  VOLTAREN Apply 4 g topically 4 (four) times daily. To affected joint.   lansoprazole 30 MG capsule Commonly known as:  PREVACID Take 1 capsule (30 mg total) by mouth daily at 12 noon.   meclizine 25 MG tablet Commonly known as:  ANTIVERT Take 1 tablet (25 mg total) by mouth 3 (three) times daily as needed for dizziness or nausea.   methocarbamol 500 MG tablet Commonly known as:  ROBAXIN Take 1 tablet (500 mg total) by mouth every 8 (eight) hours as needed for muscle spasms.   predniSONE 20 MG tablet Commonly known as:  DELTASONE 2 pills today then 1 pill daily  x2 days then half pill x2 days, take after eating        Return for neck pain/chronic issues as needed .  I tried to discuss with patient and daughter that she does not need to see 2 different primary care physicians and if she is also seen her sports medicine physician for primary care then she technically would not need to return to this office as patient did indicate that she plan to continue to follow-up with her sports medicine physician regarding her neck pain and other chronic medical issues.

## 2018-04-17 ENCOUNTER — Ambulatory Visit (INDEPENDENT_AMBULATORY_CARE_PROVIDER_SITE_OTHER): Payer: BLUE CROSS/BLUE SHIELD | Admitting: Physical Therapy

## 2018-04-17 DIAGNOSIS — R293 Abnormal posture: Secondary | ICD-10-CM

## 2018-04-17 DIAGNOSIS — M542 Cervicalgia: Secondary | ICD-10-CM | POA: Diagnosis not present

## 2018-04-17 DIAGNOSIS — M25542 Pain in joints of left hand: Secondary | ICD-10-CM

## 2018-04-17 DIAGNOSIS — R29898 Other symptoms and signs involving the musculoskeletal system: Secondary | ICD-10-CM | POA: Diagnosis not present

## 2018-04-17 NOTE — Therapy (Addendum)
[place], [place], [address] Phone: [phone]   Fax:  [phone]  Physical Therapy Treatment/Discharge  Patient Details  Name: Erin Nielsen MRN: [id number] Date of Birth: [birth year] Referring Provider (PT): Dr Lynne Leader    Encounter Date: 04/17/2018  PT End of Session - 04/17/18 1020    Visit Number  9    Number of Visits  12    Date for PT Re-Evaluation  04/23/18    PT Start Time  5366    PT Stop Time  1100    PT Time Calculation (min)  45 min    Activity Tolerance  Patient tolerated treatment well    Behavior During Therapy  [facility] for tasks assessed/performed       Past Medical History:  Diagnosis Date  . Arthritis   . Dizziness   . Hyperlipidemia     Past Surgical History:  Procedure Laterality Date  . COLONOSCOPY N/A 03/30/2015   Procedure: COLONOSCOPY;  Surgeon: Daneil Dolin, MD;  [facility];  Service: Endoscopy;  Laterality: N/A;  1430 - pt can't come earlier/lives far away  . ESOPHAGOGASTRODUODENOSCOPY N/A 03/30/2015   Procedure: ESOPHAGOGASTRODUODENOSCOPY (EGD);  Surgeon: Daneil Dolin, MD;  [facility];  Service: Endoscopy;  Laterality: N/A;    There were no vitals filed for this visit.  Subjective Assessment - 04/17/18 1021    Subjective  Pt reports she is awaiting MRI for neck.  She feels therapy has been beneficial for hand and elbow.   She states she is taking steroid and muscle relaxer prior to therapy.  She would like to focus on her Lt hand and Rt elbow for therapy today.     Patient is accompained by:  Interpreter   Hsiao-Wei Aheron   Currently in Pain?  No/denies    Pain Score  --   up to 4-5/10 with lifting   Pain Location  Elbow    Pain Orientation  Right         [facility] PT Assessment - 04/17/18 0001      Assessment   Medical Diagnosis  Cervical dysfunction; Rt lateral epicondylitis; BPPV; Lt/Rt hand pain     Referring Provider  (PT)  Dr Lynne Leader     Onset Date/Surgical Date  08/11/17   Lt > Rt hand pain x yrs    Hand Dominance  Left    Next MD Visit  05/01/18    Prior Therapy  in [place] > 12 yrs ago       AROM   Cervical Flexion  60    Cervical Extension  58    Cervical - Right Side Bend  47    Cervical - Left Side Bend  44    Cervical - Right Rotation  62    Cervical - Left Rotation  68      Strength   Right Hand Grip (lbs)  34    Left Hand Grip (lbs)  49       [facility] Adult PT Treatment/Exercise - 04/17/18 0001      Exercises   Exercises  Wrist;Elbow;Shoulder      Elbow Exercises   Other elbow exercises  wrist ext and flexion stretch x 30 sec x 5 reps each       Neck Exercises: Seated   Other Seated Exercise  W's with 5 sec hold x 10 reps       Shoulder Exercises: ROM/Strengthening   UBE (  Upper Arm Bike)  L1: 1.5 min forward/ 1.5 min backward.       Shoulder Exercises: Stretch   Other Shoulder Stretches  midlevel doorway stretch x 2 reps of 30 sec       Wrist Exercises   Wrist Flexion  Strengthening;Right;Left;15 reps;Seated    Bar Weights/Barbell (Wrist Flexion)  2 lbs    Wrist Extension  Strengthening;Seated;Right;Left;15 reps    Bar Weights/Barbell (Wrist Extension)  1 lb    Other wrist exercises  forearm supination/pronation with 2# x 10 reps each wrist.      Other wrist exercises  Rt/Lt hand grip exercise x 10 reps with stress ball;  finger ext with rubber band x 10 reps with Rt/Lt hand.        Iontophoresis   Type of Iontophoresis  Dexamethasone    Location  Rt lateral epicondyle; Lt index finger    Dose  1.0 cc    Time  6 hour patch        PT Long Term Goals - 04/03/18 1219      PT LONG TERM GOAL #1   Title  Decrease episodes of dizziness per pt report with episodes occuring 50-75% less frequently 04/23/18    Time  6    Period  Weeks    Status  Achieved      PT LONG TERM GOAL #2   Title  Increase cervical mobility/ROM by 5-7 degrees in all planes with minimal to no pain  reported with cervical ROM 04/23/18    Time  6    Period  Weeks    Status  Partially Met      PT LONG TERM GOAL #3   Title  Patient to report 50-75% decrease in cervical pain/dysfunction 04/23/18    Time  6    Period  Weeks    Status  Partially Met      PT LONG TERM GOAL #4   Title  Independent in HEP 04/23/18    Time  6    Period  Weeks    Status  On-going      PT LONG TERM GOAL #5   Title  Improve FOTO to </= 44% limitation 04/23/18    Time  6    Period  Weeks    Status  On-going      PT LONG TERM GOAL #6   Title  Improve ROM Lt index finger with patient demonstrating composite fist with index finger tip to palm with minimal pain 04/23/18    Time  3    Period  Weeks    Status  New       Pt issued stress ball, rubber band, and HEP.  Pt verbalized understanding of exercises.    Plan - 04/17/18 1051    Clinical Impression Statement  Pt demonstrated decreased grip strength on Rt hand.  She reported some pain in Rt medial elbow with grip exercise and lateral elbow with wrist ext exercises.  Pt issued new exercises for hands/wrist/elbow.  Pt requests to hold therapy at this time and awaits MRI.      Rehab Potential  Good    PT Frequency  2x / week    PT Duration  6 weeks    PT Treatment/Interventions  Patient/family education;ADLs/Self Care Home Management;Cryotherapy;Electrical Stimulation;Iontophoresis '4mg'$ /ml Dexamethasone;Moist Heat;Traction;Ultrasound;Dry needling;Manual techniques;Neuromuscular re-education;Therapeutic activities;Therapeutic exercise;Vestibular    PT Next Visit Plan  supervising PT informed of pt's request.  Will hold therapy for 1 month and d/c if pt doesn't  return by 05/15/18    PT Home Exercise Plan  [id number]        Patient will benefit from skilled therapeutic intervention in order to improve the following deficits and impairments:  Postural dysfunction, Improper body mechanics, Pain, Increased fascial restricitons, Increased muscle spasms, Decreased  range of motion, Decreased mobility, Dizziness  Visit Diagnosis: Pain in joints of left hand  Abnormal posture  Other symptoms and signs involving the musculoskeletal system  Cervicalgia     Problem List Patient Active Problem List   Diagnosis Date Noted  . Pseudogout of hand, left 02/05/2018  . GERD (gastroesophageal reflux disease) 06/04/2016  . Bilateral knee pain 10/27/2015  . Shoulder pain, left 05/25/2015  . Hand pain, left 05/25/2015  . Gastric ulceration   . Hemorrhoid   . Melanosis coli   . Bradycardia 03/14/2015  . HTN (hypertension) 03/14/2015  . Rectal bleeding 03/09/2015  . Left wrist pain 01/12/2015  . Benign paroxysmal positional vertigo 01/09/2015   Kerin Perna, PTA 04/17/18 1:16 PM  [facility] [place], [place], [address] Phone: [phone]   Fax:  [phone]  Name: Erin Nielsen MRN: [id number] Date of Birth: 11/04/1955      PHYSICAL THERAPY DISCHARGE SUMMARY  Visits from Start of Care: 9  Current functional level related to goals / functional outcomes: See above   Remaining deficits: See above   Education / Equipment: HEP  Plan: Patient agrees to discharge.  Patient goals were partially met. Patient is being discharged due to the patient's request.  ?????    Laureen Abrahams, PT, DPT 05/18/18 3:32 PM  [facility] [address], [place] [address]  [phone] (office) [phone] (fax)

## 2018-04-20 ENCOUNTER — Ambulatory Visit: Payer: BLUE CROSS/BLUE SHIELD | Admitting: Obstetrics & Gynecology

## 2018-04-20 ENCOUNTER — Encounter: Payer: Self-pay | Admitting: Obstetrics & Gynecology

## 2018-04-20 ENCOUNTER — Telehealth: Payer: Self-pay | Admitting: Family Medicine

## 2018-04-20 VITALS — BP 125/71 | HR 67 | Ht 63.0 in | Wt 132.0 lb

## 2018-04-20 DIAGNOSIS — Z1151 Encounter for screening for human papillomavirus (HPV): Secondary | ICD-10-CM | POA: Diagnosis not present

## 2018-04-20 DIAGNOSIS — Z9189 Other specified personal risk factors, not elsewhere classified: Secondary | ICD-10-CM

## 2018-04-20 DIAGNOSIS — R351 Nocturia: Secondary | ICD-10-CM | POA: Insufficient documentation

## 2018-04-20 DIAGNOSIS — Z124 Encounter for screening for malignant neoplasm of cervix: Secondary | ICD-10-CM | POA: Diagnosis not present

## 2018-04-20 DIAGNOSIS — Z01419 Encounter for gynecological examination (general) (routine) without abnormal findings: Secondary | ICD-10-CM | POA: Diagnosis not present

## 2018-04-20 NOTE — Addendum Note (Signed)
Addended by: Kathie DikeSOLA, Yamil Dougher J on: 04/20/2018 02:44 PM   Modules accepted: Orders

## 2018-04-20 NOTE — Progress Notes (Signed)
Subjective:     Erin Nielsen is a 62 y.o. female here for a routine exam.  Current complaints: None.  Here for check.  Gynecologic History No LMP recorded. Patient is postmenopausal. Contraception: post menopausal status Last Pap: 2018. 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  Obstetric History OB History  Gravida Para Term Preterm AB Living  4 1 1   3 1   SAB TAB Ectopic Multiple Live Births  0            # Outcome Date GA Lbr Len/2nd Weight Sex Delivery Anes PTL Lv  4 AB           3 AB           2 AB           1 Term              The following portions of the patient's history were reviewed and updated as appropriate: allergies, current medications, past family history, past medical history, past social history, past surgical history and problem list.  Review of Systems Pertinent items noted in HPI and remainder of comprehensive ROS otherwise negative.    Objective:      Vitals:   04/20/18 1352  BP: 125/71  Pulse: 67  Weight: 132 lb (59.9 kg)  Height: 5\' 3"  (1.6 m)   Vitals:  WNL General appearance: alert, cooperative and no distress  HEENT: Normocephalic, without obvious abnormality, atraumatic Eyes: negative Throat: lips, mucosa, and tongue normal; teeth and gums normal  Respiratory: Clear to auscultation bilaterally  CV: Regular rate and rhythm  Breasts:  Normal appearance, no masses or tenderness, no nipple retraction or dimpling  GI: Soft, non-tender; bowel sounds normal; no masses,  no organomegaly  GU: External Genitalia:  Tanner V, no lesion Urethra:  No prolapse   Vagina: Pink, normal rugae, no blood or discharge  Cervix: No CMT, no lesion  Uterus:  Normal size and contour, non tender  Adnexa: Normal, no masses, non tender  Musculoskeletal: No edema, redness or tenderness in the calves or thighs  Skin: No lesions or rash  Lymphatic: Axillary adenopathy: none     Psychiatric: Normal mood and  behavior        Assessment:    Healthy female exam.    Plan:   1.  Pap smear with co testing 2.  Bone density--hand out on bone health given 3.  Yearly mammograms 4.  PCP taking care of other health maintenance 5.  C/o nocturia--24 hour urine and voiding diary.  May have ADH secretion problems (if >33% of urine voidis occur at night).

## 2018-04-20 NOTE — Telephone Encounter (Signed)
Pt needs premeds before MRI for claustrophobia, routing.

## 2018-04-20 NOTE — Addendum Note (Signed)
Addended by: Kathie DikeSOLA, Diahann Guajardo J on: 04/20/2018 02:45 PM   Modules accepted: Orders

## 2018-04-20 NOTE — Patient Instructions (Signed)
Preventing Osteoporosis, Adult Osteoporosis is a condition that causes the bones to get weaker. With osteoporosis, the bones become thinner, and the normal spaces in bone tissue become larger. This can make the bones weak and cause them to break more easily. People who have osteoporosis are more likely to break their wrist, spine, or hip. Even a minor accident or injury can be enough to break weak bones. Osteoporosis can occur with aging. Your body constantly replaces old bone tissue with new tissue. As you get older, you may lose bone tissue more quickly, or it may be replaced more slowly. Osteoporosis is more likely to develop if you have poor nutrition or do not get enough calcium or vitamin D. Other lifestyle factors can also play a role. By making some diet and lifestyle changes, you can help to keep your bones healthy and help to prevent osteoporosis. What nutrition changes can be made? Nutrition plays an important role in maintaining healthy, strong bones.  Make sure you get enough calcium every day from food or from calcium supplements. ? If you are age 50 or younger, aim to get 1,000 mg of calcium every day. ? If you are older than age 50, aim to get 1,200 mg of calcium every day.  Try to get enough vitamin D every day. ? If you are age 70 or younger, aim to get 600 international units (IU) every day. ? If you are older than age 70, aim to get 800 international units every day.  Follow a healthy diet. Eat plenty of foods that contain calcium and vitamin D. ? Calcium is in milk, cheese, yogurt, and other dairy products. Some fish and vegetables are also good sources of calcium. Many foods such as cereals and breads have had calcium added to them (are fortified). Check nutrition labels to see how much calcium is in a food or drink. ? Foods that contain vitamin D include milk, cereals, salmon, and tuna. Your body also makes vitamin D when you are out in the sun. Bare skin exposure to the sun on  your face, arms, legs, or back for no more than 30 minutes a day, 2 times per week is more than enough. Beyond that, it is important to use sunblock to protect your skin from sunburn, which increases your risk for skin cancer.  What lifestyle changes can be made? Making changes in your everyday life can also play an important role in preventing osteoporosis.  Stay active and get exercise every day. Ask your health care provider what types of exercise are best for you.  Do not use any products that contain nicotine or tobacco, such as cigarettes and e-cigarettes. If you need help quitting, ask your health care provider.  Limit alcohol intake to no more than 1 drink a day for nonpregnant women and 2 drinks a day for men. One drink equals 12 oz of beer, 5 oz of wine, or 1 oz of hard liquor.  Why are these changes important? Making these nutrition and lifestyle changes can:  Help you develop and maintain healthy, strong bones.  Prevent loss of bone mass and the problems that are caused by that loss, such as broken bones and delayed healing.  Make you feel better mentally and physically.  What can happen if changes are not made? Problems that can result from osteoporosis can be very serious. These may include:  A higher risk of broken bones that are painful and do not heal well.  Physical malformations, such as   a collapsed spine or a hunched back.  Problems with movement.  Where to find support: If you need help making changes to prevent osteoporosis, talk with your health care provider. You can ask for a referral to a diet and nutrition specialist (dietitian) and a physical therapist. Where to find more information: Learn more about osteoporosis from:  NIH Osteoporosis and Related Bone Diseases National Resource Center: www.niams.nih.gov/health_info/bone/osteoporosis/osteoporosis_ff.asp  U.S. Office on Women's Health:  www.womenshealth.gov/publications/our-publications/fact-sheet/osteoporosis.html  National Osteoporosis Foundation: www.nof.org/patients/what-is-osteoporosis/  Summary  Osteoporosis is a condition that causes weak bones that are more likely to break.  Eating a healthy diet and making sure you get enough calcium and vitamin D can help prevent osteoporosis.  Other ways to reduce your risk of osteoporosis include getting regular exercise and avoiding alcohol and products that contain nicotine or tobacco. This information is not intended to replace advice given to you by your health care provider. Make sure you discuss any questions you have with your health care provider. Document Released: 05/14/2015 Document Revised: 01/08/2016 Document Reviewed: 01/08/2016 Elsevier Interactive Patient Education  2018 Elsevier Inc.  

## 2018-04-21 ENCOUNTER — Encounter: Payer: BLUE CROSS/BLUE SHIELD | Admitting: Physical Therapy

## 2018-04-21 MED ORDER — LORAZEPAM 0.5 MG PO TABS: ORAL_TABLET | ORAL | 0 refills | Status: DC

## 2018-04-21 NOTE — Telephone Encounter (Signed)
Medication to take prior to MRI ordered.

## 2018-04-21 NOTE — Addendum Note (Signed)
Addended by: Rodolph BongOREY, Jossie Smoot S on: 04/21/2018 06:55 AM   Modules accepted: Orders

## 2018-04-21 NOTE — Telephone Encounter (Signed)
Patient has been advised. Maurica Omura,CMA  

## 2018-04-22 ENCOUNTER — Telehealth: Payer: Self-pay | Admitting: *Deleted

## 2018-04-22 ENCOUNTER — Encounter: Payer: Self-pay | Admitting: Obstetrics & Gynecology

## 2018-04-22 DIAGNOSIS — A630 Anogenital (venereal) warts: Secondary | ICD-10-CM | POA: Insufficient documentation

## 2018-04-22 LAB — CYTOLOGY - PAP
Diagnosis: NEGATIVE
HPV (WINDOPATH): DETECTED — AB

## 2018-04-22 NOTE — Telephone Encounter (Signed)
Pt's daughter Ms Jhonnie GarnerWen called inquiring about her mom's pap smear.  Explained that it was neg but positive with HPV.  She only needs to make sure that she gets a repeat pap smear in one year.  No further intervention at this time.

## 2018-04-22 NOTE — Telephone Encounter (Signed)
-----   Message from Lesly DukesKelly H Leggett, MD sent at 04/22/2018 12:37 PM EST ----- HPV positive and nml cytology.  Pt needs cotesting in 1 year.  RN to call with interpreter.

## 2018-04-23 ENCOUNTER — Encounter: Payer: BLUE CROSS/BLUE SHIELD | Admitting: Physical Therapy

## 2018-04-27 ENCOUNTER — Encounter: Payer: BLUE CROSS/BLUE SHIELD | Admitting: Physical Therapy

## 2018-04-27 ENCOUNTER — Ambulatory Visit (INDEPENDENT_AMBULATORY_CARE_PROVIDER_SITE_OTHER): Payer: BLUE CROSS/BLUE SHIELD

## 2018-04-27 DIAGNOSIS — M4802 Spinal stenosis, cervical region: Secondary | ICD-10-CM

## 2018-04-27 DIAGNOSIS — M5011 Cervical disc disorder with radiculopathy,  high cervical region: Secondary | ICD-10-CM | POA: Diagnosis not present

## 2018-04-27 DIAGNOSIS — M542 Cervicalgia: Secondary | ICD-10-CM

## 2018-04-29 ENCOUNTER — Encounter: Payer: BLUE CROSS/BLUE SHIELD | Admitting: Physical Therapy

## 2018-04-29 ENCOUNTER — Ambulatory Visit (INDEPENDENT_AMBULATORY_CARE_PROVIDER_SITE_OTHER): Payer: BLUE CROSS/BLUE SHIELD

## 2018-04-29 DIAGNOSIS — M8589 Other specified disorders of bone density and structure, multiple sites: Secondary | ICD-10-CM | POA: Diagnosis not present

## 2018-04-29 DIAGNOSIS — Z9189 Other specified personal risk factors, not elsewhere classified: Secondary | ICD-10-CM

## 2018-05-01 ENCOUNTER — Encounter: Payer: Self-pay | Admitting: Family Medicine

## 2018-05-01 ENCOUNTER — Ambulatory Visit (INDEPENDENT_AMBULATORY_CARE_PROVIDER_SITE_OTHER): Payer: BLUE CROSS/BLUE SHIELD | Admitting: Family Medicine

## 2018-05-01 VITALS — BP 140/67 | HR 65 | Wt 131.0 lb

## 2018-05-01 DIAGNOSIS — M11242 Other chondrocalcinosis, left hand: Secondary | ICD-10-CM

## 2018-05-01 DIAGNOSIS — M542 Cervicalgia: Secondary | ICD-10-CM | POA: Diagnosis not present

## 2018-05-01 DIAGNOSIS — M7711 Lateral epicondylitis, right elbow: Secondary | ICD-10-CM | POA: Diagnosis not present

## 2018-05-01 MED ORDER — COLCHICINE 0.6 MG PO CAPS: 1.0000 | ORAL_CAPSULE | Freq: Every day | ORAL | 1 refills | Status: DC

## 2018-05-01 MED ORDER — DICLOFENAC SODIUM 1 % TD GEL: 4.0000 g | Freq: Four times a day (QID) | TRANSDERMAL | 11 refills | Status: DC

## 2018-05-01 NOTE — Patient Instructions (Signed)
Thank you for coming in today. We will do a neck injection in [place]  You should hear about scheduling soon.  Let me know if you do not hear anything.,  Do the exercises for the elbow.  Recheck with me in 1 months or sooner if needed.    Facet Joint Block The facet joints connect the bones of the spine (vertebrae). They make it possible for you to bend, twist, and make other movements with your spine. They also keep you from bending too far, twisting too far, and making other excessive movements. A facet joint block is a procedure where a numbing medicine (anesthetic) is injected into a facet joint. Often, a type of anti-inflammatory medicine called a steroid is also injected. A facet joint block may be done to diagnose neck or back pain. If the pain gets better after a facet joint block, it means the pain is probably coming from the facet joint. If the pain does not get better, it means the pain is probably not coming from the facet joint. A facet joint block may also be done to relieve neck or back pain caused by an inflamed facet joint. A facet joint block is only done to relieve pain if the pain does not improve with other methods, such as medicine, exercise programs, and physical therapy. Tell a health care provider about:  Any allergies you have.  All medicines you are taking, including vitamins, herbs, eye drops, creams, and over-the-counter medicines.  Any problems you or family members have had with anesthetic medicines.  Any blood disorders you have.  Any surgeries you have had.  Any medical conditions you have.  Whether you are pregnant or may be pregnant. What are the risks? Generally, this is a safe procedure. However, problems may occur, including:  Bleeding.  Injury to a nerve near the injection site.  Pain at the injection site.  Weakness or numbness in areas controlled by nerves near the injection site.  Infection.  Temporary fluid retention.  Allergic  reactions to medicines or dyes.  Injury to other structures or organs near the injection site. What happens before the procedure?  Follow instructions from your health care provider about eating or drinking restrictions.  Ask your health care provider about: ? Changing or stopping your regular medicines. This is especially important if you are taking diabetes medicines or blood thinners. ? Taking medicines such as aspirin and ibuprofen. These medicines can thin your blood. Do not take these medicines before your procedure if your health care provider instructs you not to.  Do not take any new dietary supplements or medicines without asking your health care provider first.  Plan to have someone take you home after the procedure. What happens during the procedure?   You may need to remove your clothing and dress in an open-back gown.  The procedure will be done while you are lying on an X-ray table. You will most likely be asked to lie on your stomach, but you may be asked to lie in a different position if an injection will be made in your neck.  Machines will be used to monitor your oxygen levels, heart rate, and blood pressure.  If an injection will be made in your neck, an IV tube will be inserted into one of your veins. Fluids and medicine will flow directly into your body through the IV tube.  The area over the facet joint where the injection will be made will be cleaned with soap. The surrounding  skin will be covered with clean drapes.  A numbing medicine (local anesthetic) will be applied to your skin. Your skin may sting or burn for a moment.  A video X-ray machine (fluoroscopy) will be used to locate the joint. In some cases, a CT scan may be used.  A contrast dye may be injected into the facet joint area to help locate the joint.  When the joint is located, an anesthetic will be injected into the joint through the needle.  Your health care provider will ask you whether you  feel pain relief. If you do feel relief, a steroid may be injected to provide pain relief for a longer period of time. If you do not feel relief or feel only partial relief, additional injections of an anesthetic may be made in other facet joints.  The needle will be removed.  Your skin will be cleaned.  A bandage (dressing) will be applied over each injection site. The procedure may vary among health care providers and hospitals. What happens after the procedure?  You will be observed for 15-30 minutes before being allowed to go home. This information is not intended to replace advice given to you by your health care provider. Make sure you discuss any questions you have with your health care provider. Document Released: 09/18/2006 Document Revised: 05/01/2017 Document Reviewed: 01/23/2015 Elsevier Interactive Patient Education  2019 [employer]

## 2018-05-01 NOTE — Progress Notes (Signed)
Erin DingwallChunlian Nielsen is a 62 y.o. female who presents [facility] today for follow-up finger pain and neck pain.  Erin Nielsen has had pain in her left hand for some time now.  She had x-ray in August 2019 that showed changes consistent with pseudogout in the second digit MCP.  She was prescribed colchicine but it is helped a moderate amount.  Additionally she has been doing some physical therapy which is helped a bit.  She notes her pain is improved but still quite bothersome.  She is had an injection in the past and is interested in repeat injection if possible.  Additionally she notes pain in the right lateral elbow.  Pain is worse with wrist extension activities.  This is been ongoing for a few weeks without injury.  She is done some very limited home exercises so far without much results.  Additionally she notes continued bothersome neck pain.  She is been seen for this issue several times and has completed a 6-week course of physical therapy with mild improvement in her symptoms.  She notes continued right-sided neck pain especially with extension.  She had MRI which showed some bulging disks and minimal spinal stenosis without significant neural impingement.  She did not have severe facet DJD.  She notes that her insurance will change shortly encouraging her to go [facility].   ROS as above:  Exam:  BP 140/67   Pulse 65   Wt 131 lb (59.4 kg)   BMI 23.21 kg/m  Wt Readings from Last 5 Encounters:  05/01/18 131 lb (59.4 kg)  04/20/18 132 lb (59.9 kg)  04/16/18 132 lb 3.2 oz (60 kg)  03/31/18 134 lb (60.8 kg)  03/09/18 133 lb (60.3 kg)    Gen: Well NAD HEENT: EOMI,  MMM Lungs: Normal work of breathing. CTABL Heart: RRR no MRG Abd: NABS, Soft. Nondistended, Nontender Exts: Brisk capillary refill, warm and well perfused.  C-spine: Nontender to  midline normal neck motion. Right elbow normal-appearing normal motion.  Tender palpation lateral epicondyle.  Pain with resisted wrist extension.  Strength is intact.  Pulses cap refill and sensation are intact distally. Left hand relatively normal-appearing slightly swollen second MCP.  Mildly tender to palpation.  Capillary fill and sensation are intact distally.  Lab and Radiology Results No results found for this or any previous visit (from the past 72 hour(s)). Mr Cervical Spine Wo Contrast  Result Date: 04/27/2018 CLINICAL DATA:  Chronic neck pain radiating to the right arm. EXAM: MRI CERVICAL SPINE WITHOUT CONTRAST TECHNIQUE: Multiplanar, multisequence MR imaging of the cervical spine was performed. No intravenous contrast was administered. COMPARISON:  Cervical spine radiographs 03/09/2018 FINDINGS: Alignment: Normal. Vertebrae: No focal marrow lesion. No compression fracture or evidence of discitis osteomyelitis. Cord: Normal caliber and signal. Posterior Fossa, vertebral arteries, paraspinal tissues: Visualized posterior fossa is normal. Vertebral artery flow voids are preserved. No prevertebral effusion. Disc levels: Sagittal imaging includes the atlantoaxial joint to the level of the T1-2 disc space, with axial imaging of the disc spaces from C2-3 to C7-T1. C1-2: Normal. C2-3: Small central disc protrusion narrowing the ventral thecal sac. No spinal canal stenosis. No neural foraminal stenosis. C3-4: Narrowing of the ventral thecal sac secondary to mild disc bulge. Mild spinal canal stenosis. No neural foraminal stenosis. C4-5: Small disc bulge with narrowing of the ventral thecal sac and mild spinal canal stenosis. No neural foraminal stenosis. Normal facets.  C5-6: Left subarticular disc protrusion that impresses upon the left ventral aspect of the spinal cord. There is mild central spinal canal stenosis. No neural foraminal stenosis. C6-7: Normal. C7-T1: Normal. T1-T2: Normal. IMPRESSION: 1.  Mild spinal canal stenosis at the C3-C6 levels secondary to degenerative disc disease. No cord compression or signal abnormality. 2. No right-sided neural foraminal stenosis to account for the right upper extremity symptoms. Electronically Signed   By: Deatra Robinson M.D.   On: 04/27/2018 15:29   Dg Bone Density (dxa)  Result Date: 04/29/2018 EXAM: DUAL X-RAY ABSORPTIOMETRY (DXA) FOR BONE MINERAL DENSITY IMPRESSION: KELLY H LEGGETT Your patient Erin Nielsen completed a BMD test on 04/29/2018 using the Lunar IDXA DXA System (analysis version: 16.SP2) manufactured by [employer]. The following summarizes the results of our evaluation. PATIENT: Name: Erin Nielsen, Erin Nielsen Patient ID: [id number] Birth Date: 11-Mar-1956 Height: 62.0 in. Gender: Female Measured: 04/29/2018 Weight: 128.8 lbs. Indications: Asian, Estrogen Deficiency, History of Fracture (Adult), Low Calcium Intake, Postmenopausal Fractures: Tib/Fib Treatments: Multivitamin, Vitamin D ASSESSMENT: The BMD measured at Femur Neck Left is 0.721 g/cm2 with a T-score of -2.3. This patient is considered osteopenic according to World Health Organization [facility]) criteria. The scan quality is good. Site Region Measured Date Measured Age WHO YA BMD Classification T-score AP Spine L1-L4 04/29/2018 62.8 Low Bone Mass -1.5 1.009 g/cm2 DualFemur Neck Left 4/29/2018 62.8 years Low Bone Mass -2.3 0.721 g/cm2 Right Forearm Radius 33% 04/29/2018 62.8 Normal -0.5 0.832 g/cm2 World Health Organization [facility]) criteria for post-menopausal, Caucasian Women: Normal        T-score at or above -1 SD Low Bone Mass T-score between -1 and -2.5 SD Osteoporosis  T-score at or below -2.5 SD RECOMMENDATION: 1. All patients should optimize calcium and vitamin D intake. 2. Consider FDA-approved medical therapies in postmenopausal women and men age 40 years and older, based on the following: a. A hip or vertebral(clinical or morphometric) fracture b. T-score < -2.5 at the femoral neck or spine after  appropriate evaluation to exclude secondary causes c. Low bone mass (T-score between -1.0 and -2.5 at the femoral neck or spine) and a 10-year probability of a hip fracture > 3% or a 10-year probability of a major osteoporosis-related fracture > 20% based on the [place]-adapted WHO algorithm d. Clinician judgement and/or patient preferences may indicate treatment for people with 10-year fracture probabilities above or below these levels FOLLOW-UP: Patients with diagnosis of osteoporosis or at high risk for fracture should have regular bone mineral density tests. For patients eligible for Medicare, routine testing is allowed once every 2 years. The testing frequency can be increased to one year for patients who have rapidly progressing disease, those who are receiving or discontinuing medical therapy to restore bone mass, or have additional risk factors. I have reviewed this report, and agree with the above findings [facility] Radiology Patient: Erin Nielsen   Referring Physician: Lesly Dukes Birth Date: 10-Oct-1955 [age] Patient ID: [id number] Height: 62.0 in. Weight: 128.8 lbs. Measured: 04/29/2018 10:10:35 AM (16 SP 2) Gender: Female Ethnicity: Asian Analyzed: 04/29/2018 10:16:13 AM (16 SP 2) FRAX* 10-year Probability of Fracture Based on femoral neck BMD: DualFemur (Left) Major Osteoporotic Fracture: 10.5% Hip Fracture:                1.9% Population:                  [place] (Asian) Risk Factors:  History of Fracture (Adult) *FRAX is a Armed forces logistics/support/administrative officertrademark of the [employer] Centre for Metabolic Bone Disease, a World Science writerHealth Organization (WHO) [employer]. ASSESSMENT: The probability of a major osteoporotic fracture is 10.5% within the next ten years. The probability of a hip fracture is 1.9% within the next ten years. Electronically Signed   By: Bretta BangWilliam  Woodruff III M.D.   On: 04/29/2018 10:33   I personally (independently) visualized and performed the  interpretation of the images attached in this note.  Procedure: Real-time Ultrasound Guided Injection of left second MCP Device: GE Logiq E   Images permanently stored and available for review in the ultrasound unit. Verbal informed consent obtained.  Discussed risks and benefits of procedure. Warned about infection bleeding damage to structures skin hypopigmentation and fat atrophy among others. Patient expresses understanding and agreement Time-out conducted.   Noted no overlying erythema, induration, or other signs of local infection.   Skin prepped in a sterile fashion.   Local anesthesia: Topical Ethyl chloride.   With sterile technique and under real time ultrasound guidance:  40 mg of Depo-Medrol (0.5 mL of 80 mg/mL solution) and 0.5 mL of lidocaine injected easily.   Completed without difficulty   Pain immediately resolved suggesting accurate placement of the medication.   Advised to call if fevers/chills, erythema, induration, drainage, or persistent bleeding.   Images permanently stored and available for review in the ultrasound unit.  Impression: Technically successful ultrasound guided injection.        Assessment and Plan: 62 y.o. 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  Continue colchicine as needed and proceed with injection as above.  Continue diclofenac gel.  Recheck in 1 month.  Right elbow pain: Lateral epicondylitis.  Plan for home exercise program including stretching and eccentric exercises.  Start diclofenac gel as needed.  Neck pain: Patient failing physical therapy trial.  I believe that she may benefit from at least a trial of facet injections.  Patient's insurance will be encouraged [facility].  Refer to pain management clinic there to proceed with possible cervical facet injections.  I spent 25 minutes with this patient, greater than 50% was face-to-face time counseling regarding differential diagnosis treatment plan and  options..    Orders Placed This Encounter  Procedures  . Ambulatory referral to Pain Clinic    Referral Priority:   Routine    Referral Type:   Consultation    Referral Reason:   Specialty Services Required    Requested Specialty:   Pain Medicine    Number of Visits Requested:   1   Meds ordered this encounter  Medications  . diclofenac sodium (VOLTAREN) 1 % GEL    Sig: Apply 4 g topically 4 (four) times daily. To affected joint.    Dispense:  100 g    Refill:  11  . Colchicine 0.6 MG CAPS    Sig: Take 1 capsule by mouth daily.    Dispense:  90 capsule    Refill:  1     Historical information moved to improve visibility of documentation.  Past Medical History:  Diagnosis Date  . Arthritis   . Dizziness   . Hyperlipidemia    Past Surgical History:  Procedure Laterality Date  . COLONOSCOPY N/A 03/30/2015   Procedure: COLONOSCOPY;  Surgeon: Corbin Adeobert M Rourk, MD;  [facility];  Service: Endoscopy;  Laterality: N/A;  1430 - pt can't come earlier/lives far away  .  ESOPHAGOGASTRODUODENOSCOPY N/A 03/30/2015   Procedure: ESOPHAGOGASTRODUODENOSCOPY (EGD);  Surgeon: Corbin Ade, MD;  [facility];  Service: Endoscopy;  Laterality: N/A;   Social History   Tobacco Use  . Smoking status: Never Smoker  . Smokeless tobacco: Never Used  Substance Use Topics  . Alcohol use: No   family history includes Tongue cancer in her brother.  Medications: Current Outpatient Medications  Medication Sig Dispense Refill  . Colchicine 0.6 MG CAPS Take 1 capsule by mouth daily. 90 capsule 1  . diclofenac sodium (VOLTAREN) 1 % GEL Apply 4 g topically 4 (four) times daily. To affected joint. 100 g 11  . lansoprazole (PREVACID) 30 MG capsule Take 1 capsule (30 mg total) by mouth daily at 12 noon. 30 capsule 1  . meclizine (ANTIVERT) 25 MG tablet Take 1 tablet (25 mg total) by mouth 3 (three) times daily as needed for dizziness or nausea. 30 tablet 3  . methocarbamol  (ROBAXIN) 500 MG tablet Take 1 tablet (500 mg total) by mouth every 8 (eight) hours as needed for muscle spasms. 60 tablet 0  . predniSONE (DELTASONE) 20 MG tablet 2 pills today then 1 pill daily x2 days then half pill x2 days, take after eating 5 tablet 1   No current facility-administered medications for this visit.    Allergies  Allergen Reactions  . Ibuprofen      Discussed warning signs or symptoms. Please see discharge instructions. Patient expresses understanding.

## 2018-05-12 ENCOUNTER — Encounter

## 2018-05-12 ENCOUNTER — Other Ambulatory Visit: Payer: Self-pay | Admitting: Family Medicine

## 2018-05-14 ENCOUNTER — Encounter: Payer: Self-pay | Admitting: Obstetrics & Gynecology

## 2018-05-14 DIAGNOSIS — M810 Age-related osteoporosis without current pathological fracture: Secondary | ICD-10-CM

## 2018-05-14 DIAGNOSIS — Z780 Asymptomatic menopausal state: Secondary | ICD-10-CM | POA: Insufficient documentation

## 2018-05-14 DIAGNOSIS — M8580 Other specified disorders of bone density and structure, unspecified site: Secondary | ICD-10-CM | POA: Insufficient documentation

## 2018-05-18 ENCOUNTER — Ambulatory Visit: Payer: BLUE CROSS/BLUE SHIELD | Admitting: Obstetrics & Gynecology

## 2018-05-18 ENCOUNTER — Encounter: Payer: Self-pay | Admitting: Obstetrics & Gynecology

## 2018-05-18 ENCOUNTER — Ambulatory Visit (INDEPENDENT_AMBULATORY_CARE_PROVIDER_SITE_OTHER): Payer: BLUE CROSS/BLUE SHIELD | Admitting: Family Medicine

## 2018-05-18 ENCOUNTER — Encounter: Payer: Self-pay | Admitting: Family Medicine

## 2018-05-18 VITALS — BP 133/79 | HR 64 | Ht 63.0 in | Wt 134.0 lb

## 2018-05-18 VITALS — BP 127/70 | HR 64 | Wt 134.0 lb

## 2018-05-18 DIAGNOSIS — M542 Cervicalgia: Secondary | ICD-10-CM

## 2018-05-18 DIAGNOSIS — M11242 Other chondrocalcinosis, left hand: Secondary | ICD-10-CM

## 2018-05-18 DIAGNOSIS — L739 Follicular disorder, unspecified: Secondary | ICD-10-CM

## 2018-05-18 DIAGNOSIS — R351 Nocturia: Secondary | ICD-10-CM

## 2018-05-18 DIAGNOSIS — M810 Age-related osteoporosis without current pathological fracture: Secondary | ICD-10-CM

## 2018-05-18 DIAGNOSIS — M8580 Other specified disorders of bone density and structure, unspecified site: Secondary | ICD-10-CM

## 2018-05-18 MED ORDER — CALCIUM CARBONATE-VITAMIN D 500-200 MG-UNIT PO TABS: 1.0000 | ORAL_TABLET | Freq: Two times a day (BID) | ORAL | 11 refills | Status: DC

## 2018-05-18 MED ORDER — MUPIROCIN 2 % EX OINT: TOPICAL_OINTMENT | CUTANEOUS | 3 refills | Status: DC

## 2018-05-18 MED ORDER — DICLOFENAC SODIUM 1 % TD GEL: 4.0000 g | Freq: Four times a day (QID) | TRANSDERMAL | 11 refills | Status: DC

## 2018-05-18 NOTE — Progress Notes (Signed)
   Subjective:    Patient ID: Erin Nielsen, female    DOB: Sep 30, 1955, 63 y.o.   MRN: [id number]  HPI  63 year old female presents for test results.  Patient was complaining of nocturia and did a voiding diary.  43% of urine was after going to bed.  Patient also noted to have osteopenia on a bone density scan.  She had a fracture of her left lower leg in [place] and wore a cast for a month.  She had some other pain once in [place] and she had an x-ray and there is no fracture (august 2019).  Patient does not intake a lot of calcium in her diet.  Review of Systems  Constitutional:       Does not sweat  Respiratory: Negative.   Cardiovascular: Negative.   Genitourinary:       +Nocturia       Objective:   Physical Exam Vitals signs reviewed.  Constitutional:      General: She is not in acute distress.    Appearance: She is well-developed.  HENT:     Head: Normocephalic and atraumatic.  Eyes:     Conjunctiva/sclera: Conjunctivae normal.  Cardiovascular:     Rate and Rhythm: Normal rate.  Pulmonary:     Effort: Pulmonary effort is normal.  Skin:    General: Skin is warm and dry.  Neurological:     Mental Status: She is alert and oriented to person, place, and time.    Vitals:   05/18/18 1357  BP: 133/79  Pulse: 64  Weight: 134 lb (60.8 kg)  Height: 5\' 3"  (1.6 m)    Assessment & Plan:  63 yo female with nocturia--verified with voiding diary Osteopenia  1-Refer to PCP to see if they can address nocturia with ADH or other treatment.   2.  Osteopenia--reviewed diet.  Eats lot of rice and little diary.  Will supplement with Calcium and Vit D.  Pt should take the Vit D her PCP gives as well as she is only taking it every other day and she will need Vit D with the calcium for absorption.    40 minutes spent face-to-face with patient with greater than 50% counseling.  [place] interpreter needed for intense communication and to verify patient understands plan.

## 2018-05-18 NOTE — Progress Notes (Signed)
Harland DingwallChunlian Cozzolino is a 63 y.o. female who [facility] today for follow-up left index finger, neck pain.  [name] was seen previously less than a month ago for left hand pain and received a steroid injection at left second MCP.  She notes her fingers feel a lot better now.  Additionally she had neck pain thought to be due to facet degenerative disease and was referred for facet injections.  She notes her neck pain has been improving a bit and she like to hold off.  Additionally she notes that her insurance changed encouraging her to have [facility].  She thinks she may have to transition her care to a different physician.  Additionally she notes a small painful bump in the inside of her right nostril.  This is been present for a few days.  She not tried any treatment for it yet no fevers chills nausea vomiting or diarrhea.  ROS as above:  Exam:  BP 127/70   Pulse 64   Wt 134 lb (60.8 kg)   BMI 23.74 kg/m  Wt Readings from Last 5 Encounters:  05/18/18 134 lb (60.8 kg)  05/01/18 131 lb (59.4 kg)  04/20/18 132 lb (59.9 kg)  04/16/18 132 lb 3.2 oz (60 kg)  03/31/18 134 lb (60.8 kg)    Gen: Well NAD HEENT: EOMI,  MMM small erythematous papule right internal lateral nostril Lungs: Normal work of breathing. CTABL Heart: RRR no MRG Abd: NABS, Soft. Nondistended, Nontender Exts: Brisk capillary refill, warm and well perfused.  Left hand normal-appearing nontender normal motion  Lab and Radiology Results No results found for this or any previous visit (from the past 72 hour(s)). No results found.    Assessment and Plan: 63 y.o. female with nostril pain is likely folliculitis.  Treat with mupirocin antibiotic ointment watchful waiting recheck as needed.  Finger pain improved following injection continue diclofenac gel as needed.  Neck  pain: Watchful waiting proceed with facet injections as needed.  Reasonable to transition care based on insurance needs.  She is of course always welcome back.  PDMP not reviewed this encounter. No orders of the defined types were placed in this encounter.  No orders of the defined types were placed in this encounter.    Historical information moved to improve visibility of documentation.  Past Medical History:  Diagnosis Date  . Arthritis   . Dizziness   . Hyperlipidemia    Past Surgical History:  Procedure Laterality Date  . COLONOSCOPY N/A 03/30/2015   Procedure: COLONOSCOPY;  Surgeon: Corbin Adeobert M Rourk, MD;  [facility];  Service: Endoscopy;  Laterality: N/A;  1430 - pt can't come earlier/lives far away  . ESOPHAGOGASTRODUODENOSCOPY N/A 03/30/2015   Procedure: ESOPHAGOGASTRODUODENOSCOPY (EGD);  Surgeon: Corbin Adeobert M Rourk, MD;  [facility];  Service: Endoscopy;  Laterality: N/A;   Social History   Tobacco Use  . Smoking status: Never Smoker  . Smokeless tobacco: Never Used  Substance Use Topics  . Alcohol use: No   family history includes Tongue cancer in her brother.  Medications: Current Outpatient Medications  Medication Sig Dispense Refill  . Colchicine 0.6 MG CAPS Take 1 capsule by mouth daily. 90 capsule 1  . diclofenac sodium (VOLTAREN) 1 % GEL Apply 4 g topically 4 (four) times daily. To affected joint. 100 g 11  . lansoprazole (PREVACID) 30 MG capsule TAKE ONE CAPSULE  BY MOUTH DAILY AT 12 NOON 30 capsule 5  . meclizine (ANTIVERT) 25 MG tablet Take 1 tablet (25 mg total) by mouth 3 (three) times daily as needed for dizziness or nausea. 30 tablet 3  . methocarbamol (ROBAXIN) 500 MG tablet Take 1 tablet (500 mg total) by mouth every 8 (eight) hours as needed for muscle spasms. 60 tablet 0   No current facility-administered medications for this visit.    Allergies  Allergen Reactions  . Ibuprofen      Discussed warning signs or symptoms.  Please see discharge instructions. Patient expresses understanding.

## 2018-05-18 NOTE — Patient Instructions (Addendum)
Thank you for coming in today. I am happy to see you as needed.  Recheck as needed with me.  Rub the ointment in the nose 2x daily for 1 week.  Use the gel on the knees and hand as needed.

## 2018-05-19 ENCOUNTER — Ambulatory Visit: Payer: BLUE CROSS/BLUE SHIELD | Admitting: Family Medicine

## 2018-05-21 ENCOUNTER — Telehealth: Payer: Self-pay

## 2018-05-21 NOTE — Telephone Encounter (Signed)
Per Dr.Leggett, I spoke with Jun Bang (Tennille's daughter) and let her know that Dr.Corey can see her for her urinary issues. Jun expressed understanding and said she will get an appt set up for her mother with Dr.Corey.

## 2018-05-22 ENCOUNTER — Ambulatory Visit: Payer: BLUE CROSS/BLUE SHIELD

## 2018-05-27 DIAGNOSIS — L0201 Cutaneous abscess of face: Secondary | ICD-10-CM | POA: Insufficient documentation

## 2018-06-05 DIAGNOSIS — G8929 Other chronic pain: Secondary | ICD-10-CM | POA: Insufficient documentation

## 2018-06-07 DIAGNOSIS — M4802 Spinal stenosis, cervical region: Secondary | ICD-10-CM | POA: Insufficient documentation

## 2018-06-07 DIAGNOSIS — G43009 Migraine without aura, not intractable, without status migrainosus: Secondary | ICD-10-CM | POA: Insufficient documentation

## 2018-06-07 DIAGNOSIS — E7800 Pure hypercholesterolemia, unspecified: Secondary | ICD-10-CM | POA: Insufficient documentation

## 2018-07-21 ENCOUNTER — Other Ambulatory Visit: Payer: Self-pay | Admitting: Family Medicine

## 2018-11-09 DIAGNOSIS — M7711 Lateral epicondylitis, right elbow: Secondary | ICD-10-CM | POA: Insufficient documentation

## 2018-11-09 DIAGNOSIS — M65242 Calcific tendinitis, left hand: Secondary | ICD-10-CM | POA: Insufficient documentation

## 2018-11-09 DIAGNOSIS — M7541 Impingement syndrome of right shoulder: Secondary | ICD-10-CM | POA: Insufficient documentation

## 2018-11-09 DIAGNOSIS — M65322 Trigger finger, left index finger: Secondary | ICD-10-CM | POA: Insufficient documentation

## 2019-01-04 DIAGNOSIS — M1712 Unilateral primary osteoarthritis, left knee: Secondary | ICD-10-CM | POA: Insufficient documentation

## 2019-01-04 DIAGNOSIS — M170 Bilateral primary osteoarthritis of knee: Secondary | ICD-10-CM | POA: Insufficient documentation

## 2019-02-06 IMAGING — MR MR CERVICAL SPINE W/O CM
5 series · 34 of 48 positions shown · non-contrast
Comparison: Cervical spine radiographs 03/09/2018

CLINICAL DATA: Chronic neck pain radiating to the right arm.

EXAM:
MRI CERVICAL SPINE WITHOUT CONTRAST
TECHNIQUE: Multiplanar, multisequence MR imaging of the cervical spine was
performed. No intravenous contrast was administered.

[Series 2: T2 · sagittal · 3.0mm · 0.56mm/px · 8 of 13 slices shown (1 of 2)]
[im 1/13]
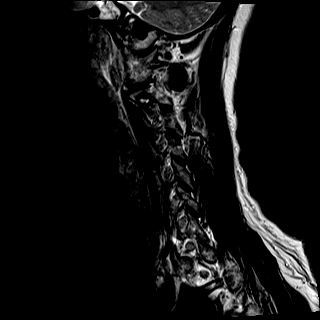
[im 2/13]
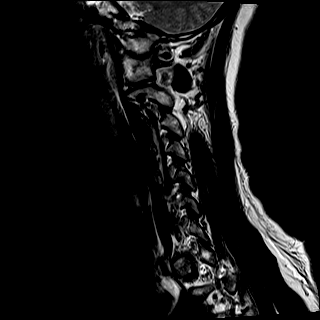
[im 4/13]
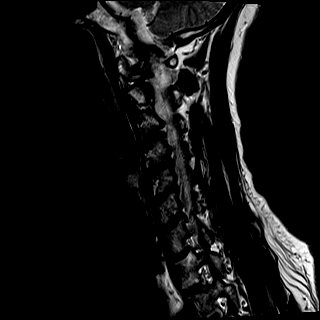
[im 6/13]
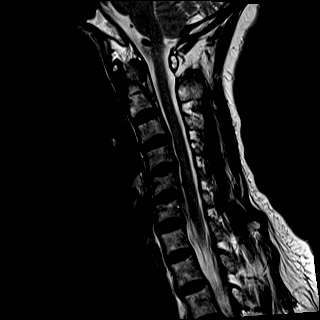
[im 7/13]
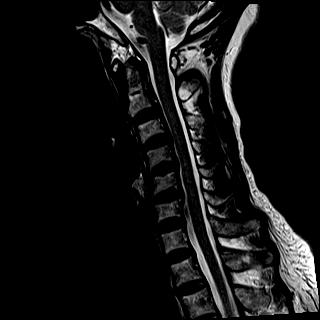
[im 9/13]
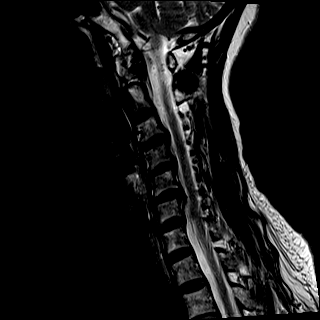
[im 11/13]
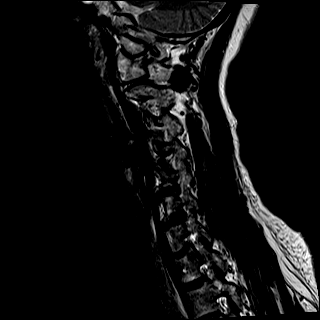
[im 13/13]
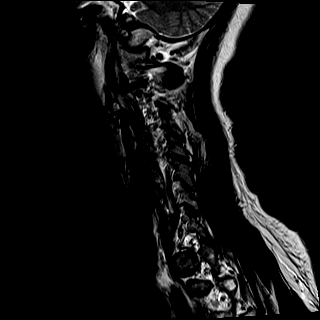

[Series 3: T1 · sagittal · 3.0mm · 0.70mm/px · 7 of 13 slices shown]
[im 1/13]
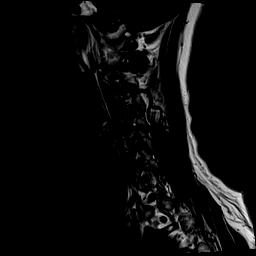
[im 3/13]
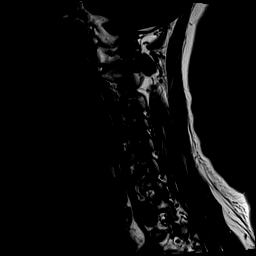
[im 5/13]
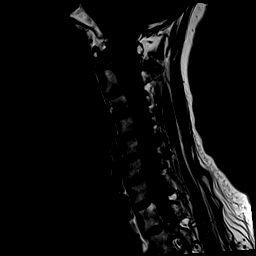
[im 7/13]
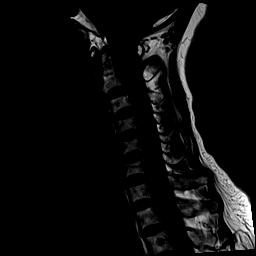
[im 9/13]
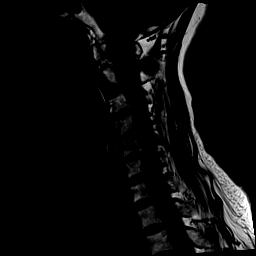
[im 11/13]
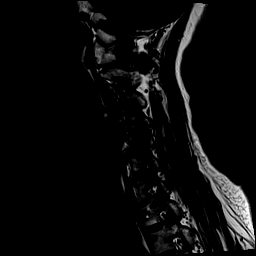
[im 13/13]
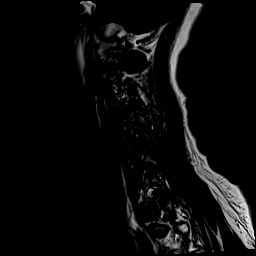

[Series 4: STIR · sagittal · 3.0mm · 0.35mm/px · 7 of 13 slices shown]
[im 1/13]
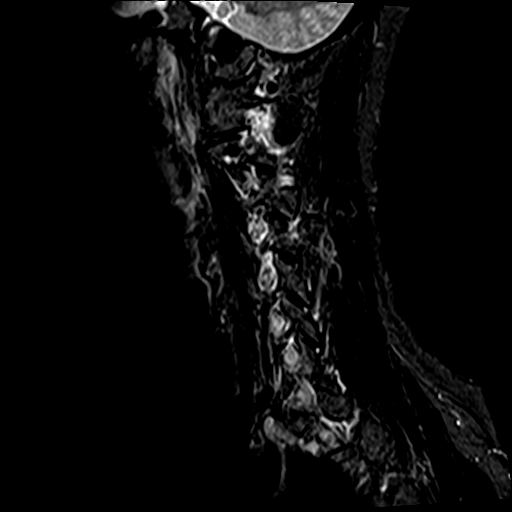
[im 3/13]
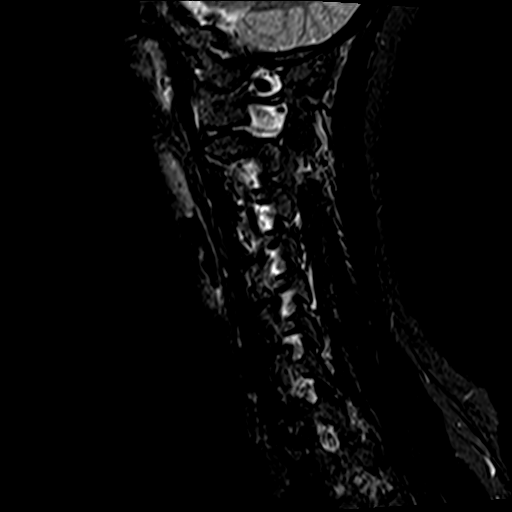
[im 5/13]
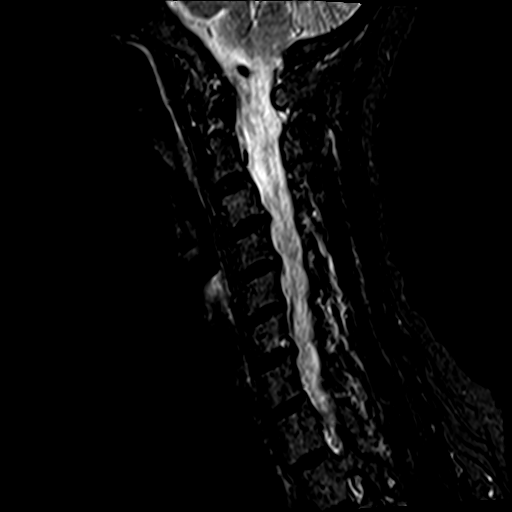
[im 7/13]
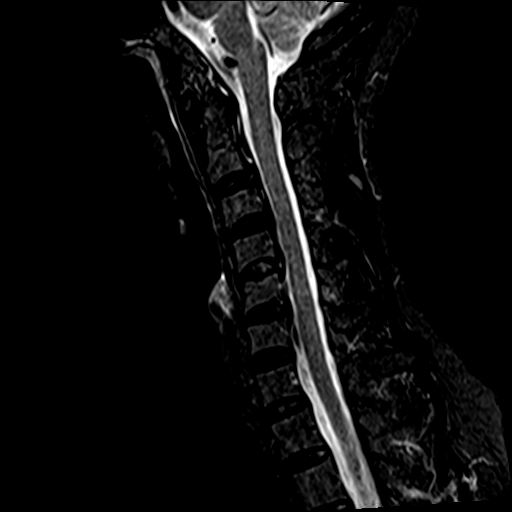
[im 9/13]
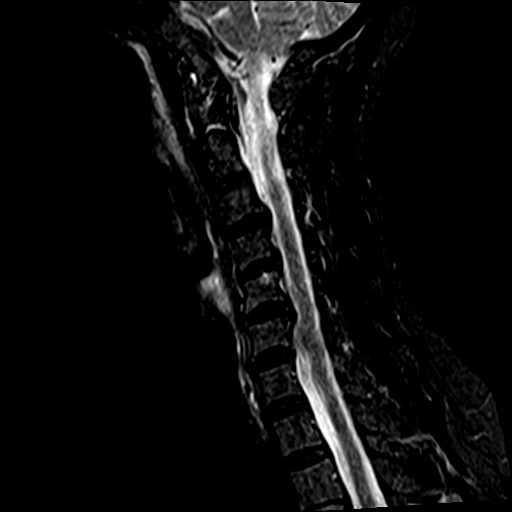
[im 11/13]
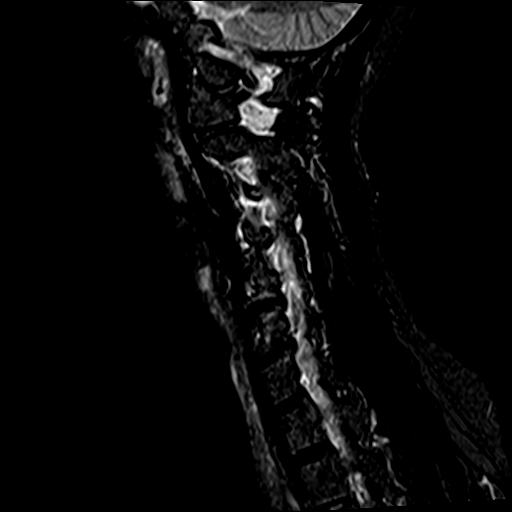
[im 13/13]
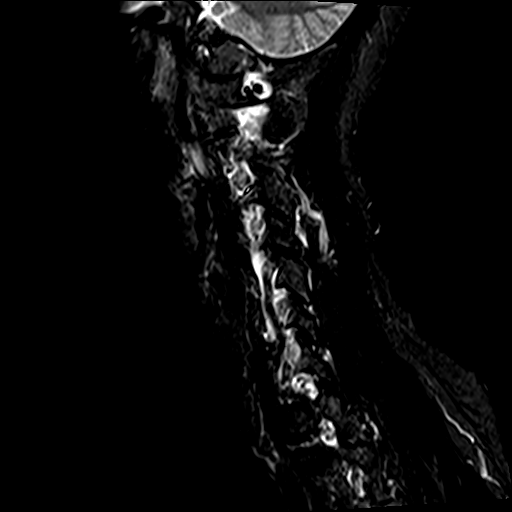

[Series 5: T2 · axial · 3.0mm · 0.62mm/px · z∈[-77,+12]mm · 9 of 25 slices shown (2 of 2)]
[im 1/25]
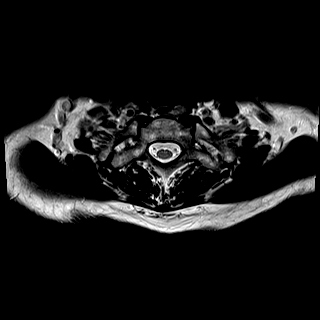
[im 5/25]
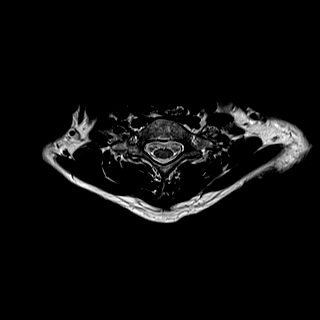
[im 9/25]
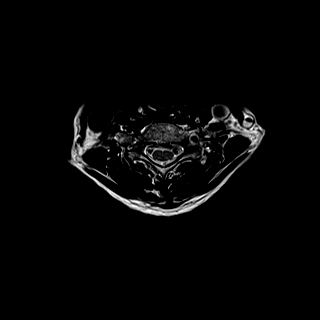
[im 11/25]
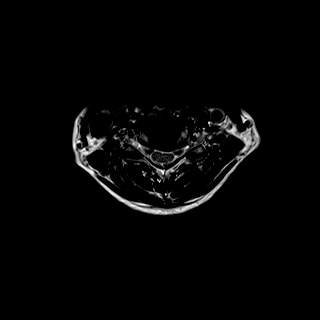
[im 13/25]
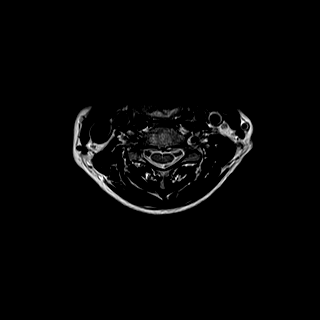
[im 15/25]
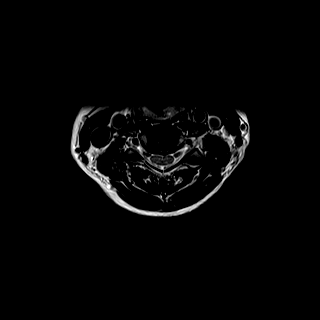
[im 17/25]
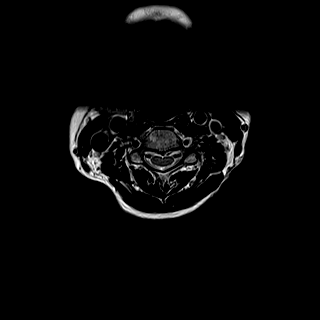
[im 21/25]
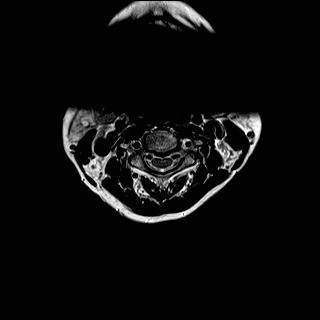
[im 25/25]
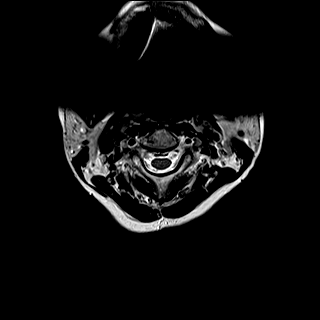

[Series 6: mpgr ax · axial · 3.0mm · 0.35mm/px · z∈[-67,-37]mm · 3 of 25 slices shown]
[im 1/25]
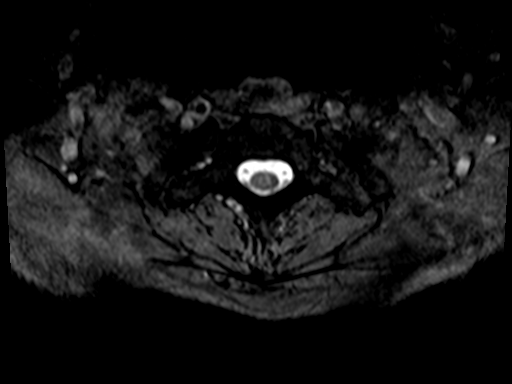
[im 5/25]
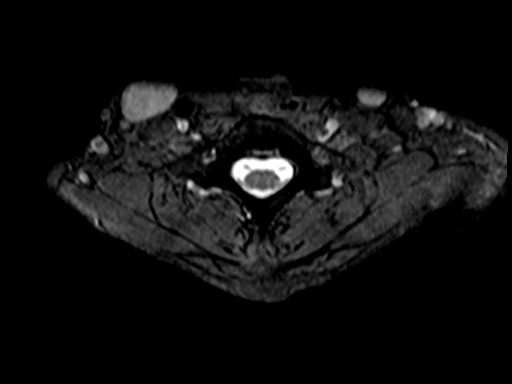
[im 9/25]
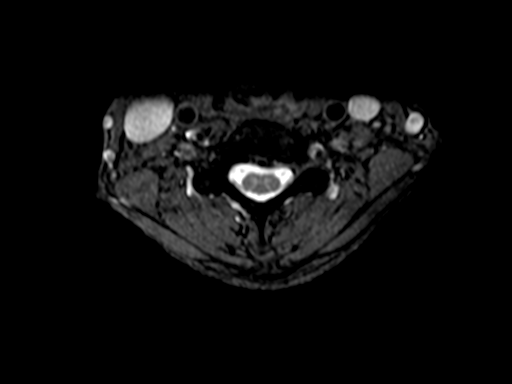

[34 of 48 positions shown; findings below may reference images not displayed]

FINDINGS: Alignment: Normal.

Vertebrae: No focal marrow lesion. No compression fracture or
evidence of discitis osteomyelitis.

Cord: Normal caliber and signal.

Posterior Fossa, vertebral arteries, paraspinal tissues: Visualized
posterior fossa is normal. Vertebral artery flow voids are
preserved. No prevertebral effusion.

Disc levels: Sagittal imaging includes the atlantoaxial joint to the
level of the T1-2 disc space, with axial imaging of the disc spaces
from C2-3 to C7-T1.

C1-2: Normal.

C2-3: Small central disc protrusion narrowing the ventral thecal
sac. No spinal canal stenosis. No neural foraminal stenosis.

C3-4: Narrowing of the ventral thecal sac secondary to mild disc
bulge. Mild spinal canal stenosis. No neural foraminal stenosis.

C4-5: Small disc bulge with narrowing of the ventral thecal sac and
mild spinal canal stenosis. No neural foraminal stenosis. Normal
facets.

C5-6: Left subarticular disc protrusion that impresses upon the left
ventral aspect of the spinal cord. There is mild central spinal
canal stenosis. No neural foraminal stenosis.

C6-7: Normal.

C7-T1: Normal.

T1-T2: Normal.
IMPRESSION: 1. Mild spinal canal stenosis at the C3-C6 levels secondary to
degenerative disc disease. No cord compression or signal
abnormality.
2. No right-sided neural foraminal stenosis to account for the right
upper extremity symptoms.

## 2019-03-02 ENCOUNTER — Other Ambulatory Visit: Payer: Self-pay | Admitting: Family Medicine

## 2019-05-13 ENCOUNTER — Encounter

## 2019-06-06 DIAGNOSIS — Z8619 Personal history of other infectious and parasitic diseases: Secondary | ICD-10-CM | POA: Insufficient documentation

## 2019-07-13 ENCOUNTER — Other Ambulatory Visit: Payer: Self-pay | Admitting: Family Medicine

## 2019-07-20 MED FILL — DENTA 5000 PLUS CREAM: 1.1 | 30 days supply | Qty: 51 | Fill #0

## 2019-07-20 MED FILL — AMOXICILLIN 500 MG CAPSULE: 500 | 7 days supply | Qty: 21 | Fill #0

## 2019-08-05 MED FILL — CHLORHEXIDINE 0.12% RINSE: 0.12 | 32 days supply | Qty: 946 | Fill #0

## 2019-08-21 ENCOUNTER — Other Ambulatory Visit: Payer: Self-pay | Admitting: Family Medicine

## 2019-09-09 ENCOUNTER — Other Ambulatory Visit: Payer: Self-pay | Admitting: Family Medicine

## 2019-09-14 MED FILL — TRIAMCINOLONE 0.1% OINTMEN: 0.1 | 10 days supply | Qty: 15 | Fill #0

## 2019-09-14 MED FILL — METHYLPREDNISOLONE 4 MG TBP: 4 | 6 days supply | Qty: 21 | Fill #0

## 2019-09-20 DIAGNOSIS — B001 Herpesviral vesicular dermatitis: Secondary | ICD-10-CM | POA: Insufficient documentation

## 2019-12-01 ENCOUNTER — Other Ambulatory Visit: Payer: Self-pay | Admitting: Family Medicine

## 2019-12-19 DIAGNOSIS — N870 Mild cervical dysplasia: Secondary | ICD-10-CM | POA: Insufficient documentation

## 2019-12-22 MED FILL — NYSTATIN 100,000 UNITS/ML S: 100000 | 12 days supply | Qty: 300 | Fill #0

## 2020-07-28 ENCOUNTER — Other Ambulatory Visit: Payer: Self-pay

## 2020-07-28 ENCOUNTER — Ambulatory Visit (INDEPENDENT_AMBULATORY_CARE_PROVIDER_SITE_OTHER): Payer: 59 | Admitting: Family Medicine

## 2020-07-28 ENCOUNTER — Encounter: Payer: Self-pay | Admitting: Family Medicine

## 2020-07-28 VITALS — BP 136/78 | HR 62 | Temp 98.0°F

## 2020-07-28 DIAGNOSIS — J302 Other seasonal allergic rhinitis: Secondary | ICD-10-CM

## 2020-07-28 DIAGNOSIS — H1131 Conjunctival hemorrhage, right eye: Secondary | ICD-10-CM

## 2020-07-28 DIAGNOSIS — R030 Elevated blood-pressure reading, without diagnosis of hypertension: Secondary | ICD-10-CM | POA: Diagnosis not present

## 2020-07-28 NOTE — Patient Instructions (Addendum)
Get some allergy medicine over the counter for her seasonal allergies - Claritin or Zyrtec are usually good. Generic brand is fine! Check her blood pressure occasionally so we can have some home readings to compare to see if she needs to go on any blood pressure medicines. If her eye symptoms worsen or she begins having vision changes or severe pain, go the Emergency Department.  Referral placed for eye doctor.    Subconjunctival Hemorrhage Subconjunctival hemorrhage is bleeding that happens between the white part of your eye (sclera) and the clear membrane that covers the outside of your eye (conjunctiva). There are many tiny blood vessels near the surface of your eye. A subconjunctival hemorrhage happens when one or more of these vessels breaks and bleeds, causing a red patch to appear on your eye. This is similar to a bruise. Depending on the amount of bleeding, the red patch may only cover a small area of your eye or it may cover the entire visible part of the sclera. If a lot of blood collects under the conjunctiva, there may also be swelling. Subconjunctival hemorrhages do not affect your vision or cause pain, but your eye may feel irritated if there is swelling. Subconjunctival hemorrhages usually do not require treatment, and they usually disappear on their own within two weeks. What are the causes? This condition may be caused by:  Mild trauma, such as rubbing your eye too hard.  Blunt injuries, such as from playing sports or having contact with a deployed airbag.  Coughing, sneezing, or vomiting.  Straining, such as when lifting a heavy object.  High blood pressure.  Recent eye surgery.  Diabetes.  Certain medicines, especially blood thinners (anticoagulants).  Other conditions, such as eye tumors, bleeding disorders, or blood vessel abnormalities. Subconjunctival hemorrhages can also happen without an obvious cause. What are the signs or symptoms? Symptoms of this condition  include:  A bright red or dark red patch on the white part of the eye. The red area may: ? Spread out to cover a larger area of the eye before it goes away. ? Turn brownish-yellow before it goes away.  Swelling around the eye.  Mild eye irritation.   How is this diagnosed? This condition is diagnosed with a physical exam. If your subconjunctival hemorrhage was caused by trauma, your health care provider may refer you to an eye specialist (ophthalmologist) or another specialist to check for other injuries. You may have other tests, including:  An eye exam.  A blood pressure check.  Blood tests to check for bleeding disorders. If your subconjunctival hemorrhage was caused by trauma, X-rays or a CT scan may be done to check for other injuries. How is this treated? Usually, treatment is not needed for this condition. If you have discomfort, your health care provider may recommend eye drops or cold compresses. Follow these instructions at home:  Take over-the-counter and prescription medicines only as directed by your health care provider.  Use eye drops or cold compresses to help with discomfort as directed by your health care provider.  Avoid activities, things, and environments that may irritate or injure your eye.  Keep all follow-up visits as told by your health care provider. This is important. Contact a health care provider if:  You have pain in your eye.  The bleeding does not go away within 3 weeks.  You keep getting new subconjunctival hemorrhages. Get help right away if:  Your vision changes or you have difficulty seeing.  You suddenly develop  severe sensitivity to light.  You develop a severe headache, persistent vomiting, confusion, or abnormal tiredness (lethargy).  Your eye seems to bulge or protrude from your eye socket.  You develop unexplained bruises on your body.  You have unexplained bleeding in another area of your body. Summary  Subconjunctival  hemorrhage is bleeding that happens between the white part of your eye and the clear membrane that covers the outside of your eye.  This condition is similar to a bruise.  Subconjunctival hemorrhages usually do not require treatment, and they usually disappear on their own within two weeks.  Use eye drops or cold compresses to help with discomfort as directed by your health care provider. This information is not intended to replace advice given to you by your health care provider. Make sure you discuss any questions you have with your health care provider. Document Revised: 10/14/2018 Document Reviewed: 01/28/2018 Elsevier Patient Education  2021 [employer].

## 2020-07-28 NOTE — Progress Notes (Addendum)
Acute Office Visit  Subjective:    Patient ID: Erin Nielsen, female    DOB: [birth year], 65 y.o.   MRN: [id number]  Chief Complaint  Patient presents with  . Conjunctivitis    HPI Patient is in today for right eye redness.  Daughter went over to patient's house this morning and noticed her right eye had a red patch and was concerned. Patient denies any pain, itching, vision changes, drainage. She reports that she has had felt like something is in her eye for years and has been following closely with the eye doctor, but they have never found a cause - said it was probably related to the shape of her eye and the way her eyelids rub against it. Patient said nothing has since changed, no new symptoms. She last saw an eye doctor last year, but he is out of network now so she would like a referral to one in our system for her next annual exam.   Patient states she has been working in the yard over the past several days, lifting heavy objects and planting trees. She also reports some increased sneezing lately while working outside along with some mild seasonal allergies. Daughter also thinks her blood pressure may be a little high because she had a headache a few days ago indicating it may be high - she is not on any blood pressure medications. Headache was accompanied by some brief dizziness that only lasted a few seconds, no other symptoms; self resolved.   No vision changes, no blurred vision, no floaters, no eye pain. Last saw an eye doctor sometime last year. Wears reading glasses occasionally.   Past Medical History:  Diagnosis Date  . Arthritis   . Dizziness   . Hyperlipidemia     Past Surgical History:  Procedure Laterality Date  . COLONOSCOPY N/A 03/30/2015   Procedure: COLONOSCOPY;  Surgeon: Corbin Ade, MD;  Location: [facility];  Service: Endoscopy;  Laterality: N/A;  1430 - pt can't come earlier/lives far away  . ESOPHAGOGASTRODUODENOSCOPY N/A 03/30/2015   Procedure:  ESOPHAGOGASTRODUODENOSCOPY (EGD);  Surgeon: Corbin Ade, MD;  Location: [facility];  Service: Endoscopy;  Laterality: N/A;    Family History  Problem Relation Age of Onset  . Tongue cancer Brother   . Colon cancer Neg Hx     Social History   Socioeconomic History  . Marital status: Married    Spouse name: Not on file  . Number of children: 1  . Years of education: Not on file  . Highest education level: Not on file  Occupational History  . Not on file  Tobacco Use  . Smoking status: Never Smoker  . Smokeless tobacco: Never Used  Vaping Use  . Vaping Use: Never used  Substance and Sexual Activity  . Alcohol use: No  . Drug use: No  . Sexual activity: Not Currently    Birth control/protection: None  Other Topics Concern  . Not on file  Social History Narrative  . Not on file   Social Determinants of Health   Financial Resource Strain: Not on file  Food Insecurity: Not on file  Transportation Needs: Not on file  Physical Activity: Not on file  Stress: Not on file  Social Connections: Not on file  Intimate Partner Violence: Not on file    Outpatient Medications Prior to Visit  Medication Sig Dispense Refill  . calcium-vitamin D (OSCAL WITH D) 500-200 MG-UNIT tablet Take 1 tablet by mouth 2 (two) times  daily. 60 tablet 11  . diclofenac sodium (VOLTAREN) 1 % GEL Apply 4 g topically 4 (four) times daily. To affected joint. 500 g 11  . lansoprazole (PREVACID) 30 MG capsule TAKE ONE CAPSULE BY MOUTH DAILY AT 12 NOON 30 capsule 5  . meclizine (ANTIVERT) 25 MG tablet Take 1 tablet (25 mg total) by mouth 3 (three) times daily as needed for dizziness or nausea. 30 tablet 3  . methocarbamol (ROBAXIN) 500 MG tablet Take 1 tablet (500 mg total) by mouth every 8 (eight) hours as needed for muscle spasms. 60 tablet 0  . MITIGARE 0.6 MG CAPS TAKE 1 CAPSULE BY MOUTH EVERY DAY 90 capsule 3  . mupirocin ointment (BACTROBAN) 2 % Apply to affected area in nose BID for 7 days.  (Patient not taking: Reported on 05/18/2018) 30 g 3   No facility-administered medications prior to visit.    Allergies  Allergen Reactions  . Ibuprofen     Review of Systems     Objective:    Physical Exam Constitutional:      Appearance: Normal appearance. She is normal weight.  HENT:     Head: Normocephalic and atraumatic.     Nose: Nose normal.  Eyes:     General: Lids are normal.        Right eye: No discharge.        Left eye: No discharge.     Extraocular Movements: Extraocular movements intact.     Pupils: Pupils are equal, round, and reactive to light.      Comments: Snellen: L = 20/30, R = 20/25  Neurological:     General: No focal deficit present.     Mental Status: She is alert and oriented to person, place, and time.  Psychiatric:        Mood and Affect: Mood normal.        Behavior: Behavior normal.        Thought Content: Thought content normal.        Judgment: Judgment normal.     BP 136/78   Pulse 62   Temp 98 F (36.7 C)   SpO2 98%  Wt Readings from Last 3 Encounters:  05/18/18 134 lb (60.8 kg)  05/18/18 134 lb (60.8 kg)  05/01/18 131 lb (59.4 kg)    Health Maintenance Due  Topic Date Due  . MAMMOGRAM  01/25/2017  . PAP SMEAR-Modifier  04/21/2019  . INFLUENZA VACCINE  12/12/2019  . COVID-19 Vaccine (3 - Booster for Moderna series) 01/02/2020  . PNA vac Low Risk Adult (1 of 2 - PCV13) Never done    There are no preventive care reminders to display for this patient.   Lab Results  Component Value Date   TSH 2.451 03/14/2015   Lab Results  Component Value Date   WBC 7.2 04/03/2018   HGB 12.3 04/03/2018   HCT 36.6 04/03/2018   MCV 88.0 04/03/2018   PLT 289 04/03/2018   Lab Results  Component Value Date   NA 140 04/03/2018   K 4.2 04/03/2018   CO2 28 04/03/2018   GLUCOSE 87 04/03/2018   BUN 9 04/03/2018   CREATININE 0.73 04/03/2018   BILITOT 0.7 04/03/2018   ALKPHOS 47 06/04/2016   AST 27 04/03/2018   ALT 19 04/03/2018    PROT 7.3 04/03/2018   ALBUMIN 4.0 06/04/2016   CALCIUM 9.1 04/03/2018   Lab Results  Component Value Date   CHOL 202 (H) 04/03/2018   Lab Results  Component  Value Date   HDL 47 (L) 04/03/2018   Lab Results  Component Value Date   LDLCALC 131 (H) 04/03/2018   Lab Results  Component Value Date   TRIG 129 04/03/2018   Lab Results  Component Value Date   CHOLHDL 4.3 04/03/2018   No results found for: HGBA1C     Assessment & Plan:   1. Subconjunctival hemorrhage of right eye Presentation consistent with subconjunctival hemorrhage. No red flags today. Educated on healing process/timeline and any signs and symptoms that would require urgent evaluation including pain and vision changes. She would like a referral for her annual eye exam to an eye doctor since her current doctor is no longer in her insurance plan.  - Ambulatory referral to Ophthalmology  2. Elevated blood pressure reading Mildly elevated reading today, but given the occasional headaches and now subconjunctival hemorrhage, recommend she check her BP occasionally at home and bring a log to next visit so we can see if she is remaining elevated.  3. Seasonal allergies Given increase in sneezing and rhinorrhea with the season change and being outdoors more, recommend she try a daily antihistamine like Claritin or Zyrtec - they preferred to look OTC.    Follow-up in about 1-2 weeks to reassess eye and blood pressure or sooner if needed.    Daughter at appointment to translate for patient - they declined interpreter services.   Clayborne Dana, NP

## 2020-08-04 ENCOUNTER — Ambulatory Visit: Payer: 59 | Admitting: Family Medicine

## 2020-08-16 ENCOUNTER — Ambulatory Visit (INDEPENDENT_AMBULATORY_CARE_PROVIDER_SITE_OTHER): Payer: 59 | Admitting: Medical-Surgical

## 2020-08-16 ENCOUNTER — Encounter: Payer: Self-pay | Admitting: Medical-Surgical

## 2020-08-16 ENCOUNTER — Other Ambulatory Visit: Payer: Self-pay

## 2020-08-16 VITALS — BP 151/82 | HR 63 | Temp 98.8°F | Ht 61.0 in | Wt 132.7 lb

## 2020-08-16 DIAGNOSIS — Z23 Encounter for immunization: Secondary | ICD-10-CM

## 2020-08-16 DIAGNOSIS — Z789 Other specified health status: Secondary | ICD-10-CM

## 2020-08-16 DIAGNOSIS — Z780 Asymptomatic menopausal state: Secondary | ICD-10-CM

## 2020-08-16 DIAGNOSIS — I10 Essential (primary) hypertension: Secondary | ICD-10-CM | POA: Diagnosis not present

## 2020-08-16 DIAGNOSIS — Z1231 Encounter for screening mammogram for malignant neoplasm of breast: Secondary | ICD-10-CM

## 2020-08-16 DIAGNOSIS — K219 Gastro-esophageal reflux disease without esophagitis: Secondary | ICD-10-CM

## 2020-08-16 DIAGNOSIS — M8580 Other specified disorders of bone density and structure, unspecified site: Secondary | ICD-10-CM

## 2020-08-16 DIAGNOSIS — K0889 Other specified disorders of teeth and supporting structures: Secondary | ICD-10-CM

## 2020-08-16 DIAGNOSIS — Z7689 Persons encountering health services in other specified circumstances: Secondary | ICD-10-CM | POA: Diagnosis not present

## 2020-08-16 DIAGNOSIS — E559 Vitamin D deficiency, unspecified: Secondary | ICD-10-CM

## 2020-08-16 DIAGNOSIS — Z0000 Encounter for general adult medical examination without abnormal findings: Secondary | ICD-10-CM

## 2020-08-16 DIAGNOSIS — Z603 Acculturation difficulty: Secondary | ICD-10-CM

## 2020-08-16 NOTE — Progress Notes (Signed)
HPI: Erin Nielsen is a 65 y.o. female who  has a past medical history of Arthritis, Dizziness, and Hyperlipidemia.  she presents to [facility] today, 08/16/20,  for chief complaint of: Annual physical exam  Hypertension-history of elevated blood pressure readings with recent subconjunctival hemorrhage.  Since the hemorrhage developed, she has been monitoring blood pressures at home with her average readings in the 120s-130s/70s-80s.  Has never been medicated for high blood pressure.  Does not add salt to her foods and avoids high sodium dietary intake.  Tooth pain-went to a dentist last year who told her she had inflammation in the tooth and gave her a prescription for medication.  She did take this medication and later had the tooth repaired.  Unfortunately she feels that the pain from that tooth has returned and would like to have a refill of this medication.  She has not returned to her dentist to have this evaluated.  Vitamin D deficiency-currently taking vitamin D oral supplementation, tolerating well without side effects.  Notes that she was told low vitamin D causes her recurrent cold sores so she decided to start taking supplements.  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  Taking Valtrex as prescribed as needed.  GERD-taking lansoprazole as prescribed, tolerating well without side effects.  Feels this works well to help control her reflux symptoms.  Osteopenia-last DEXA scan noted a T score of -2.3, overdue for repeat scan.  Concerns: None  Past medical, surgical, social and family history reviewed:  Patient Active Problem List   Diagnosis Date Noted  . Osteopenia after menopause 05/14/2018  . HPV (human papilloma virus) anogenital infection 04/22/2018  . Nocturia 04/20/2018  . Pseudogout of hand, left 02/05/2018  . GERD (gastroesophageal reflux disease) 06/04/2016  . Bilateral knee pain 10/27/2015  . Shoulder  pain, left 05/25/2015  . Hand pain, left 05/25/2015  . Gastric ulceration   . Hemorrhoid   . Melanosis coli   . Bradycardia 03/14/2015  . HTN (hypertension) 03/14/2015  . Rectal bleeding 03/09/2015  . Left wrist pain 01/12/2015  . Benign paroxysmal positional vertigo 01/09/2015    Past Surgical History:  Procedure Laterality Date  . COLONOSCOPY N/A 03/30/2015   Procedure: COLONOSCOPY;  Surgeon: Corbin Ade, MD;  Location: [facility];  Service: Endoscopy;  Laterality: N/A;  1430 - pt can't come earlier/lives far away  . ESOPHAGOGASTRODUODENOSCOPY N/A 03/30/2015   Procedure: ESOPHAGOGASTRODUODENOSCOPY (EGD);  Surgeon: Corbin Ade, MD;  Location: [facility];  Service: Endoscopy;  Laterality: N/A;    Social History   Tobacco Use  . Smoking status: Never Smoker  . Smokeless tobacco: Never Used  Substance Use Topics  . Alcohol use: No    Family History  Problem Relation Age of Onset  . Tongue cancer Brother   . Colon cancer Neg Hx      Current medication list and allergy/intolerance information reviewed:    Current Outpatient Medications  Medication Sig Dispense Refill  . lansoprazole (PREVACID) 30 MG capsule TAKE ONE CAPSULE BY MOUTH DAILY AT 12 NOON 30 capsule 5  . valACYclovir (VALTREX) 1000 MG tablet Take 2 tablets by mouth. TWICE DAILY FOR 1 DAY AS NEEDED FOR COLD SORES     No current facility-administered medications for this visit.    Allergies  Allergen Reactions  . Ibuprofen     Other reaction(s): Dizziness (intolerance)      Review of Systems:  Constitutional:  No  fever, no chills,  No recent illness, No unintentional weight changes. No significant fatigue.   HEENT: No  headache, no vision change, no hearing change, No sore throat, No  sinus pressure  Cardiac: + chest pain, No  pressure, No palpitations, No  Orthopnea  Respiratory:  No  shortness of breath. No  Cough  Gastrointestinal: + epigastric abdominal pain, No  nausea, No  vomiting,   No  blood in stool, No  diarrhea, No  constipation   Musculoskeletal: No new myalgia/arthralgia  Skin: No  Rash, No other wounds/concerning lesions  Genitourinary: No  incontinence, No  abnormal genital bleeding, No abnormal genital discharge, + nocturia 4-5 times nightly  Hem/Onc: No  easy bruising/bleeding, No  abnormal lymph node  Endocrine: No cold intolerance,  No heat intolerance. No polyuria/polydipsia/polyphagia   Neurologic: No  weakness, + dizziness, No  slurred speech/focal weakness/facial droop  Psychiatric: No  concerns with depression, + concerns with anxiety, + sleep problems, No mood problems  Exam:  BP (!) 151/82   Pulse 63   Temp 98.8 F (37.1 C)   Ht 5\' 1"  (1.549 m)   Wt 132 lb 11.2 oz (60.2 kg)   SpO2 96%   BMI 25.07 kg/m   Constitutional: VS see above. General Appearance: alert, well-developed, well-nourished, NAD  Eyes: Normal lids and conjunctive, non-icteric sclera  Ears, Nose, Mouth, Throat: MMM, Normal external inspection ears/nares/mouth/lips/gums. TM normal bilaterally.   Neck: No masses, trachea midline. No thyroid enlargement. No tenderness/mass appreciated. No lymphadenopathy  Respiratory: Normal respiratory effort. no wheeze, no rhonchi, no rales  Cardiovascular: S1/S2 normal, no murmur, no rub/gallop auscultated. RRR. No lower extremity edema. Pedal pulse II/IV bilaterally PT. No carotid bruit or JVD. No abdominal aortic bruit.  Gastrointestinal: Nontender, no masses. No hepatomegaly, no splenomegaly. No hernia appreciated. Bowel sounds normal. Rectal exam deferred.   Musculoskeletal: Gait normal. No clubbing/cyanosis of digits.   Neurological: Normal balance/coordination. No tremor. No cranial nerve deficit on limited exam. Motor and sensation intact and symmetric. Cerebellar reflexes intact.   Skin: warm, dry, intact. No rash/ulcer. No concerning nevi or subq nodules on limited exam.    Psychiatric: Normal judgment/insight. Normal  mood and affect. Oriented x3.    No results found for this or any previous visit (from the past 72 hour(s)).  No results found.   ASSESSMENT/PLAN:   1. Encounter to establish care Reviewed available information and discussed healthcare concerns with patient.  2. Annual physical exam Checking CBC with differential, CMP, and lipid panel today. - CBC with Differential/Platelet - COMPLETE METABOLIC PANEL WITH GFR - Lipid panel  3. Language barrier Onsite interpreter present for appointment.  Note entered in chart to have an interpreter present for all future appointments  4. Primary hypertension Home blood pressure monitoring shows blood pressures at or near goal, no indication to start medications today.  Continue low-sodium diet and continue with regular exercise.  5. Vitamin D deficiency Checking vitamin D today.  Continue oral supplementation. - VITAMIN D 25 Hydroxy (Vit-D Deficiency, Fractures)  6. Gastroesophageal reflux disease, unspecified whether esophagitis present Well managed, continue lansoprazole.  7. Osteopenia after menopause Updating bone density scan today. - DG Bone Density; Future  8. Encounter for screening mammogram for malignant neoplasm of breast Repeat mammogram ordered. - MM DIGITAL SCREENING BILATERAL; Future  9. Need for shingles vaccine Shingles #1 given in office today.  Will need to return in 2-6 months for her second dose. - Varicella-zoster vaccine IM  10. Tooth pain As I am unsure what  medication was sent in to treat her pain, we can contact her pharmacy to see if they have a record of this.  Would recommend contacting her dentist for reevaluation as she may have an abscess forming or some other dental issue that will need intervention   Orders Placed This Encounter  Procedures  . MM DIGITAL SCREENING BILATERAL  . DG Bone Density  . Varicella-zoster vaccine IM  . CBC with Differential/Platelet  . COMPLETE METABOLIC PANEL WITH GFR  .  Lipid panel  . VITAMIN D 25 Hydroxy (Vit-D Deficiency, Fractures)    No orders of the defined types were placed in this encounter.   Patient Instructions  Recombinant Zoster (Shingles) Vaccine: What You Need to Know 1. Why get vaccinated? Recombinant zoster (shingles) vaccine can prevent shingles. Shingles (also called herpes zoster, or just zoster) is a painful skin rash, usually with blisters. In addition to the rash, shingles can cause fever, headache, chills, or upset stomach. More rarely, shingles can lead to pneumonia, hearing problems, blindness, brain inflammation (encephalitis), or death. The most common complication of shingles is long-term nerve pain called postherpetic neuralgia (PHN). PHN occurs in the areas where the shingles rash was, even after the rash clears up. It can last for months or years after the rash goes away. The pain from PHN can be severe and debilitating. About 10 to 18% of people who get shingles will experience PHN. The risk of PHN increases with age. An older adult with shingles is more likely to develop PHN and have longer lasting and more severe pain than a younger person with shingles. Shingles is caused by the varicella zoster virus, the same virus that causes chickenpox. After you have chickenpox, the virus stays in your body and can cause shingles later in life. Shingles cannot be passed from one person to another, but the virus that causes shingles can spread and cause chickenpox in someone who had never had chickenpox or received chickenpox vaccine. 2. Recombinant shingles vaccine Recombinant shingles vaccine provides strong protection against shingles. By preventing shingles, recombinant shingles vaccine also protects against PHN. Recombinant shingles vaccine is the preferred vaccine for the prevention of shingles. However, a different vaccine, live shingles vaccine, may be used in some circumstances. The recombinant shingles vaccine is recommended for  adults 50 years and older without serious immune problems. It is given as a two-dose series. This vaccine is also recommended for people who have already gotten another type of shingles vaccine, the live shingles vaccine. There is no live virus in this vaccine. Shingles vaccine may be given at the same time as other vaccines. 3. Talk with your health care provider Tell your vaccine provider if the person getting the vaccine:  Has had an allergic reaction after a previous dose of recombinant shingles vaccine, or has any severe, life-threatening allergies.  Is pregnant or breastfeeding.  Is currently experiencing an episode of shingles. In some cases, your health care provider may decide to postpone shingles vaccination to a future visit. People with minor illnesses, such as a cold, may be vaccinated. People who are moderately or severely ill should usually wait until they recover before getting recombinant shingles vaccine. Your health care provider can give you more information. 4. Risks of a vaccine reaction  A sore arm with mild or moderate pain is very common after recombinant shingles vaccine, affecting about 80% of vaccinated people. Redness and swelling can also happen at the site of the injection.  Tiredness, muscle pain, headache, shivering, fever,  stomach pain, and nausea happen after vaccination in more than half of people who receive recombinant shingles vaccine. In clinical trials, about 1 out of 6 people who got recombinant zoster vaccine experienced side effects that prevented them from doing regular activities. Symptoms usually went away on their own in 2 to 3 days. You should still get the second dose of recombinant zoster vaccine even if you had one of these reactions after the first dose. People sometimes faint after medical procedures, including vaccination. Tell your provider if you feel dizzy or have vision changes or ringing in the ears. As with any medicine, there is a  very remote chance of a vaccine causing a severe allergic reaction, other serious injury, or death. 5. What if there is a serious problem? An allergic reaction could occur after the vaccinated person leaves the clinic. If you see signs of a severe allergic reaction (hives, swelling of the face and throat, difficulty breathing, a fast heartbeat, dizziness, or weakness), call 9-1-1 and get the person to the nearest hospital. For other signs that concern you, call your health care provider. Adverse reactions should be reported to the Vaccine Adverse Event Reporting System (VAERS). Your health care provider will usually file this report, or you can do it yourself. Visit the VAERS website at www.vaers.LAgents.nohhs.gov or call 236-032-14381-773-008-3105. VAERS is only for reporting reactions, and VAERS staff do not give medical advice. 6. How can I learn more?  Ask your health care provider.  Call your local or state health department.  Contact the Centers for Disease Control and Prevention (CDC): ? Call (717)619-53271-4780815557 (1-800-CDC-INFO) or ? Visit CDC's website at PicCapture.uywww.cdc.gov/vaccines Vaccine Information Statement Recombinant Zoster Vaccine (03/11/2018) This information is not intended to replace advice given to you by your health care provider. Make sure you discuss any questions you have with your health care provider. Document Revised: 12/31/2019 Document Reviewed: 12/31/2019 Elsevier Patient Education  2021 Elsevier Inc.   Follow-up plan: Return in about 6 months (around 02/15/2021) for HTN, HLD follow up.  Thayer OhmJoy L. Eunice Oldaker, DNP, APRN, FNP-BC [facility]

## 2020-08-16 NOTE — Patient Instructions (Signed)
Recombinant Zoster (Shingles) Vaccine: What You Need to Know 1. Why get vaccinated? Recombinant zoster (shingles) vaccine can prevent shingles. Shingles (also called herpes zoster, or just zoster) is a painful skin rash, usually with blisters. In addition to the rash, shingles can cause fever, headache, chills, or upset stomach. More rarely, shingles can lead to pneumonia, hearing problems, blindness, brain inflammation (encephalitis), or death. The most common complication of shingles is long-term nerve pain called postherpetic neuralgia (PHN). PHN occurs in the areas where the shingles rash was, even after the rash clears up. It can last for months or years after the rash goes away. The pain from PHN can be severe and debilitating. About 10 to 18% of people who get shingles will experience PHN. The risk of PHN increases with age. An older adult with shingles is more likely to develop PHN and have longer lasting and more severe pain than a younger person with shingles. Shingles is caused by the varicella zoster virus, the same virus that causes chickenpox. After you have chickenpox, the virus stays in your body and can cause shingles later in life. Shingles cannot be passed from one person to another, but the virus that causes shingles can spread and cause chickenpox in someone who had never had chickenpox or received chickenpox vaccine. 2. Recombinant shingles vaccine Recombinant shingles vaccine provides strong protection against shingles. By preventing shingles, recombinant shingles vaccine also protects against PHN. Recombinant shingles vaccine is the preferred vaccine for the prevention of shingles. However, a different vaccine, live shingles vaccine, may be used in some circumstances. The recombinant shingles vaccine is recommended for adults 50 years and older without serious immune problems. It is given as a two-dose series. This vaccine is also recommended for people who have already gotten  another type of shingles vaccine, the live shingles vaccine. There is no live virus in this vaccine. Shingles vaccine may be given at the same time as other vaccines. 3. Talk with your health care provider Tell your vaccine provider if the person getting the vaccine:  Has had an allergic reaction after a previous dose of recombinant shingles vaccine, or has any severe, life-threatening allergies.  Is pregnant or breastfeeding.  Is currently experiencing an episode of shingles. In some cases, your health care provider may decide to postpone shingles vaccination to a future visit. People with minor illnesses, such as a cold, may be vaccinated. People who are moderately or severely ill should usually wait until they recover before getting recombinant shingles vaccine. Your health care provider can give you more information. 4. Risks of a vaccine reaction  A sore arm with mild or moderate pain is very common after recombinant shingles vaccine, affecting about 80% of vaccinated people. Redness and swelling can also happen at the site of the injection.  Tiredness, muscle pain, headache, shivering, fever, stomach pain, and nausea happen after vaccination in more than half of people who receive recombinant shingles vaccine. In clinical trials, about 1 out of 6 people who got recombinant zoster vaccine experienced side effects that prevented them from doing regular activities. Symptoms usually went away on their own in 2 to 3 days. You should still get the second dose of recombinant zoster vaccine even if you had one of these reactions after the first dose. People sometimes faint after medical procedures, including vaccination. Tell your provider if you feel dizzy or have vision changes or ringing in the ears. As with any medicine, there is a very remote chance of a vaccine causing   a severe allergic reaction, other serious injury, or death. 5. What if there is a serious problem? An allergic reaction  could occur after the vaccinated person leaves the clinic. If you see signs of a severe allergic reaction (hives, swelling of the face and throat, difficulty breathing, a fast heartbeat, dizziness, or weakness), call 9-1-1 and get the person to the nearest hospital. For other signs that concern you, call your health care provider. Adverse reactions should be reported to the Vaccine Adverse Event Reporting System (VAERS). Your health care provider will usually file this report, or you can do it yourself. Visit the VAERS website at www.vaers.hhs.gov or call 1-800-822-7967. VAERS is only for reporting reactions, and VAERS staff do not give medical advice. 6. How can I learn more?  Ask your health care provider.  Call your local or state health department.  Contact the Centers for Disease Control and Prevention (CDC): ? Call 1-800-232-4636 (1-800-CDC-INFO) or ? Visit CDC's website at www.cdc.gov/vaccines Vaccine Information Statement Recombinant Zoster Vaccine (03/11/2018) This information is not intended to replace advice given to you by your health care provider. Make sure you discuss any questions you have with your health care provider. Document Revised: 12/31/2019 Document Reviewed: 12/31/2019 Elsevier Patient Education  2021 Elsevier Inc.  

## 2020-08-17 ENCOUNTER — Telehealth: Payer: Self-pay

## 2020-08-17 NOTE — Telephone Encounter (Signed)
Spoke with [facility] who verified the pt is currently taking:  Lansoprazole DR 20 mg qd  He states she just picked up a #30 day supply of this med a week ago

## 2020-08-18 NOTE — Telephone Encounter (Signed)
Since she has no other active prescriptions, unable to determine what medication the dentist prescribed for her tooth inflammation/pain. Recommend she call her dentist office for their recommendations regarding her returned symptoms.

## 2020-08-19 LAB — COMPLETE METABOLIC PANEL WITH GFR
AG Ratio: 1.4 (calc) (ref 1.0–2.5)
ALT: 17 U/L (ref 6–29)
AST: 25 U/L (ref 10–35)
Albumin: 4.4 g/dL (ref 3.6–5.1)
Alkaline phosphatase (APISO): 50 U/L (ref 37–153)
BUN: 12 mg/dL (ref 7–25)
CO2: 27 mmol/L (ref 20–32)
Calcium: 9 mg/dL (ref 8.6–10.4)
Chloride: 104 mmol/L (ref 98–110)
Creat: 0.72 mg/dL (ref 0.50–0.99)
GFR, Est African American: 102 mL/min/{1.73_m2} (ref >=60)
GFR, Est Non African American: 88 mL/min/{1.73_m2} (ref >=60)
Globulin: 3.2 g/dL (calc) (ref 1.9–3.7)
Glucose, Bld: 90 mg/dL (ref 65–99)
Potassium: 4 mmol/L (ref 3.5–5.3)
Sodium: 139 mmol/L (ref 135–146)
Total Bilirubin: 0.7 mg/dL (ref 0.2–1.2)
Total Protein: 7.6 g/dL (ref 6.1–8.1)

## 2020-08-19 LAB — CBC WITH DIFFERENTIAL/PLATELET
Absolute Monocytes: 517 cells/uL (ref 200–950)
Basophils Absolute: 68 cells/uL (ref 0–200)
Basophils Relative: 1 %
Eosinophils Absolute: 877 cells/uL — ABNORMAL HIGH (ref 15–500)
Eosinophils Relative: 12.9 %
HCT: 39.6 % (ref 35.0–45.0)
Hemoglobin: 12.9 g/dL (ref 11.7–15.5)
Lymphs Abs: 2632 cells/uL (ref 850–3900)
MCH: 29.3 pg (ref 27.0–33.0)
MCHC: 32.6 g/dL (ref 32.0–36.0)
MCV: 90 fL (ref 80.0–100.0)
MPV: 8.6 fL (ref 7.5–12.5)
Monocytes Relative: 7.6 %
Neutro Abs: 2706 cells/uL (ref 1500–7800)
Neutrophils Relative %: 39.8 %
Platelets: 247 10*3/uL (ref 140–400)
RBC: 4.4 10*6/uL (ref 3.80–5.10)
RDW: 11.9 % (ref 11.0–15.0)
Total Lymphocyte: 38.7 %
WBC: 6.8 10*3/uL (ref 3.8–10.8)

## 2020-08-19 LAB — LIPID PANEL
Cholesterol: 200 mg/dL — ABNORMAL HIGH (ref <200)
HDL: 55 mg/dL (ref >=50)
LDL Cholesterol (Calc): 124 mg/dL (calc) — ABNORMAL HIGH
Non-HDL Cholesterol (Calc): 145 mg/dL (calc) — ABNORMAL HIGH (ref <130)
Total CHOL/HDL Ratio: 3.6 (calc) (ref <5.0)
Triglycerides: 99 mg/dL (ref <150)

## 2020-08-19 LAB — VITAMIN D 25 HYDROXY (VIT D DEFICIENCY, FRACTURES): Vit D, 25-Hydroxy: 34 ng/mL (ref 30–100)

## 2020-08-21 ENCOUNTER — Encounter: Payer: Self-pay | Admitting: Medical-Surgical

## 2020-08-21 DIAGNOSIS — E7800 Pure hypercholesterolemia, unspecified: Secondary | ICD-10-CM

## 2020-08-21 MED ORDER — AMOXICILLIN 500 MG PO CAPS
500.0000 mg | ORAL_CAPSULE | Freq: Three times a day (TID) | ORAL | 0 refills | Status: DC
  Filled 2020-08-21: qty 21, 7d supply, fill #0

## 2020-08-21 MED ORDER — TRAMADOL HCL 50 MG PO TABS
50.0000 mg | ORAL_TABLET | Freq: Two times a day (BID) | ORAL | 0 refills | Status: DC | PRN
  Filled 2020-08-21: qty 10, 5d supply, fill #0

## 2020-08-22 ENCOUNTER — Telehealth: Payer: Self-pay

## 2020-08-22 ENCOUNTER — Other Ambulatory Visit: Payer: Self-pay

## 2020-08-22 MED ORDER — TRAMADOL HCL 50 MG PO TABS: 50.0000 mg | ORAL_TABLET | Freq: Two times a day (BID) | ORAL | 0 refills | Status: AC | PRN

## 2020-08-22 MED ORDER — AMOXICILLIN 500 MG PO CAPS: 500.0000 mg | ORAL_CAPSULE | Freq: Three times a day (TID) | ORAL | 0 refills | Status: DC

## 2020-08-22 MED ORDER — ATORVASTATIN CALCIUM 20 MG PO TABS: 20.0000 mg | ORAL_TABLET | Freq: Every day | ORAL | 3 refills | Status: DC

## 2020-08-22 NOTE — Telephone Encounter (Signed)
Spoke with Tresa Endo at [facility] and cancelled the Rx's for amoxicillin and tramadol.

## 2020-08-22 NOTE — Telephone Encounter (Signed)
I tried to call to cancel. On hold 10 minutes no answer.

## 2020-08-22 NOTE — Telephone Encounter (Signed)
Patient aware of recommendations. No further questions or concerns at this time.

## 2020-08-22 NOTE — Addendum Note (Signed)
Addended byChristen Butter on: 08/22/2020 07:38 AM   Modules accepted: Orders

## 2020-08-22 NOTE — Telephone Encounter (Signed)
Please contact the [facility] at [phone] and cancel the prescriptions sent 08/21/2020 for Amoxicillin and Tramadol. They were sent to the wrong pharmacy.   Thanks, Ander Slade

## 2020-09-13 ENCOUNTER — Ambulatory Visit (INDEPENDENT_AMBULATORY_CARE_PROVIDER_SITE_OTHER): Payer: 59

## 2020-09-13 ENCOUNTER — Other Ambulatory Visit: Payer: Self-pay

## 2020-09-13 DIAGNOSIS — Z1231 Encounter for screening mammogram for malignant neoplasm of breast: Secondary | ICD-10-CM | POA: Diagnosis not present

## 2020-09-13 DIAGNOSIS — M85851 Other specified disorders of bone density and structure, right thigh: Secondary | ICD-10-CM

## 2020-09-13 DIAGNOSIS — M8589 Other specified disorders of bone density and structure, multiple sites: Secondary | ICD-10-CM

## 2020-09-13 DIAGNOSIS — M8580 Other specified disorders of bone density and structure, unspecified site: Secondary | ICD-10-CM

## 2020-09-13 DIAGNOSIS — Z780 Asymptomatic menopausal state: Secondary | ICD-10-CM

## 2020-10-26 ENCOUNTER — Other Ambulatory Visit: Payer: Self-pay

## 2020-10-26 ENCOUNTER — Encounter: Payer: Self-pay | Admitting: Physician Assistant

## 2020-10-26 ENCOUNTER — Ambulatory Visit (INDEPENDENT_AMBULATORY_CARE_PROVIDER_SITE_OTHER): Payer: 59 | Admitting: Physician Assistant

## 2020-10-26 VITALS — Temp 98.5°F

## 2020-10-26 DIAGNOSIS — Z23 Encounter for immunization: Secondary | ICD-10-CM

## 2020-10-26 NOTE — Progress Notes (Signed)
Pt here for 2nd shingrix vaccine.

## 2021-01-01 ENCOUNTER — Other Ambulatory Visit: Payer: Self-pay

## 2021-01-01 ENCOUNTER — Encounter: Payer: Self-pay | Admitting: Family Medicine

## 2021-01-01 ENCOUNTER — Ambulatory Visit (INDEPENDENT_AMBULATORY_CARE_PROVIDER_SITE_OTHER): Payer: 59 | Admitting: Family Medicine

## 2021-01-01 VITALS — BP 142/79 | HR 70 | Temp 98.2°F | Resp 18

## 2021-01-01 DIAGNOSIS — T300 Burn of unspecified body region, unspecified degree: Secondary | ICD-10-CM | POA: Diagnosis not present

## 2021-01-01 DIAGNOSIS — M79642 Pain in left hand: Secondary | ICD-10-CM

## 2021-01-01 DIAGNOSIS — L309 Dermatitis, unspecified: Secondary | ICD-10-CM

## 2021-01-01 MED ORDER — HYDROCORTISONE 2.5 % EX OINT: TOPICAL_OINTMENT | CUTANEOUS | 3 refills | Status: DC

## 2021-01-01 MED ORDER — DICLOFENAC SODIUM 1 % EX GEL: 4.0000 g | Freq: Four times a day (QID) | CUTANEOUS | 11 refills | Status: DC

## 2021-01-01 MED ORDER — SILVER SULFADIAZINE 1 % EX CREA: 1.0000 "application " | TOPICAL_CREAM | Freq: Every day | CUTANEOUS | 0 refills | Status: DC
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
## 2021-01-01 NOTE — Patient Instructions (Signed)
Ask pharmacist to help you find some ROLLED GAUZE.  Clean burn with mild soap and water. Pat dry. Apply silvadene cream. Top with rolled gauze.  Apply a small amount of the prescription hydrocortisone cream to the spots on your face for the next week.   Sending in another prescription for diclofenac gel for your hand pain.   Follow-up with primary provider, Joy FNP, in about a month or sooner if needed.

## 2021-05-17 ENCOUNTER — Encounter: Payer: Self-pay | Admitting: Family Medicine

## 2021-05-17 ENCOUNTER — Other Ambulatory Visit: Payer: Self-pay

## 2021-05-17 ENCOUNTER — Ambulatory Visit: Payer: 59 | Admitting: Family Medicine

## 2021-05-17 VITALS — BP 152/82 | HR 58 | Temp 98.9°F | Ht 63.0 in | Wt 134.1 lb

## 2021-05-17 DIAGNOSIS — N9089 Other specified noninflammatory disorders of vulva and perineum: Secondary | ICD-10-CM | POA: Diagnosis not present

## 2021-05-17 DIAGNOSIS — B009 Herpesviral infection, unspecified: Secondary | ICD-10-CM

## 2021-05-17 DIAGNOSIS — N6321 Unspecified lump in the left breast, upper outer quadrant: Secondary | ICD-10-CM

## 2021-05-17 DIAGNOSIS — R1013 Epigastric pain: Secondary | ICD-10-CM | POA: Diagnosis not present

## 2021-05-17 DIAGNOSIS — R0789 Other chest pain: Secondary | ICD-10-CM | POA: Diagnosis not present

## 2021-05-17 MED ORDER — FAMOTIDINE 20 MG PO TABS: 20.0000 mg | ORAL_TABLET | Freq: Every day | ORAL | 2 refills | Status: DC

## 2021-05-17 NOTE — Progress Notes (Signed)
Acute Office Visit  Subjective:    Patient ID: Erin Nielsen, female    DOB: [birth year], 66 y.o.   MRN: [id number]  Chief Complaint  Patient presents with   Breast Mass    Left, x - 2 days   GI Problem    X - 2 months   Chest Pain    X - 2 months   Blister    Outer labia area    HPI Patient is in today for breast lump, labial lesion, chest discomfort.  Left lateral breast lump: Patient states that about 3 to 4 days ago she noticed a lump to her left lateral breast that is not painful.  She denies any redness, rashes, nipple discharge.  States several years ago she had something similar to the right side breast that was biopsied and came back normal.  She denies any other symptoms.  Labial lesion: About 10 days ago patient started with a lesion to the outside of her left labia majora.  States that she did pop it and some pus drained out.  It is now scabbed over and starting to heal.  She reports that she tends to get these relatively often alternating sides.  States the tend to come up like little red pimples that are tender to the touch, come to a whitehead.  She pops them and within a few days they resolve.  She denies any surrounding rashes, edema, erythema, pain, vaginal pain or discharge.    Chest pain: For the past 2 months patient has noticed some sternal/epigastric discomfort that seems constant and always stays midline.  Never radiates.  States it feels like pressure or pulling sensation.  No sharp stabbing or burning sensation.  States her stomach medicine (PPI) was working well for a while but is not seeming to help now.  She feels like she gets slight relief if she sits up straight and takes a deep breath and pain seems to get worse if she slouches over or if she is very hungry.  States she can get some discomfort if she presses on her epigastric region but it does not produce the same pain.  She denies any nausea, vomiting, GI changes, other abdominal pain, dyspnea, other  chest pain.     Requesting daughter to translate in the room.   Past Medical History:  Diagnosis Date   Arthritis    Dizziness    Hyperlipidemia     Past Surgical History:  Procedure Laterality Date   BREAST EXCISIONAL BIOPSY Right 10/2017   COLONOSCOPY N/A 03/30/2015   Procedure: COLONOSCOPY;  Surgeon: Corbin Ade, MD;  [facility];  Service: Endoscopy;  Laterality: N/A;  1430 - pt can't come earlier/lives far away   ESOPHAGOGASTRODUODENOSCOPY N/A 03/30/2015   Procedure: ESOPHAGOGASTRODUODENOSCOPY (EGD);  Surgeon: Corbin Ade, MD;  [facility];  Service: Endoscopy;  Laterality: N/A;    Family History  Problem Relation Age of Onset   Tongue cancer Brother    Colon cancer Neg Hx     Social History   Socioeconomic History   Marital status: Married    Spouse name: Not on file   Number of children: 1   Years of education: Not on file   Highest education level: Not on file  Occupational History   Not on file  Tobacco Use   Smoking status: Never   Smokeless tobacco: Never  Vaping Use   Vaping Use: Never used  Substance and Sexual Activity  Alcohol use: No   Drug use: Never   Sexual activity: Not on file  Other Topics Concern   Not on file  Social History Narrative   Not on file   Social Determinants of Health   Financial Resource Strain: Not on file  Food Insecurity: Not on file  Transportation Needs: Not on file  Physical Activity: Not on file  Stress: Not on file  Social Connections: Not on file  Intimate Partner Violence: Not on file    Outpatient Medications Prior to Visit  Medication Sig Dispense Refill   atorvastatin (LIPITOR) 20 MG tablet Take 1 tablet (20 mg total) by mouth daily. 90 tablet 3   diclofenac Sodium (VOLTAREN) 1 % GEL Apply 4 g topically 4 (four) times daily. To affected joint. 100 g 11   hydrocortisone 2.5 % ointment Apply to affected area BID. 30 g 3   lansoprazole (PREVACID) 30 MG capsule TAKE ONE  CAPSULE BY MOUTH DAILY AT 12 NOON 30 capsule 5   silver sulfADIAZINE (SILVADENE) 1 % cream Apply 1 application topically daily. 50 g 0   valACYclovir (VALTREX) 1000 MG tablet Take 2 tablets by mouth. TWICE DAILY FOR 1 DAY AS NEEDED FOR COLD SORES     No facility-administered medications prior to visit.    Allergies  Allergen Reactions   Ibuprofen     Other reaction(s): Dizziness (intolerance)    Review of Systems All review of systems negative except what is listed in the HPI     Objective:    Physical Exam Vitals reviewed.  Constitutional:      Appearance: Normal appearance.  HENT:     Head: Normocephalic and atraumatic.  Cardiovascular:     Rate and Rhythm: Normal rate and regular rhythm.     Heart sounds: Normal heart sounds.  Pulmonary:     Effort: Pulmonary effort is normal.     Breath sounds: Normal breath sounds.  Chest:  Breasts:    Left: Mass present.    Abdominal:     General: There is no distension.     Palpations: Abdomen is soft. There is no mass.     Tenderness: There is no abdominal tenderness. There is no right CVA tenderness, left CVA tenderness, guarding or rebound.     Hernia: No hernia is present.  Genitourinary:    Comments: Small <78mm scabbed pustule to lateral left labia majora, no drainage, erythema, edema, or induration Musculoskeletal:        General: Normal range of motion.     Cervical back: Normal range of motion and neck supple.  Skin:    General: Skin is warm and dry.     Findings: No bruising, erythema, lesion or rash.  Neurological:     General: No focal deficit present.     Mental Status: She is alert and oriented to person, place, and time.  Psychiatric:        Mood and Affect: Mood normal.        Behavior: Behavior normal.        Thought Content: Thought content normal.        Judgment: Judgment normal.    BP (!) 152/82 (BP Location: Left Arm, Patient Position: Sitting, Cuff Size: Normal)    Pulse (!) 58    Temp 98.9 F  (37.2 C) (Oral)    Ht 5\' 3"  (1.6 m)    Wt 134 lb 1.3 oz (60.8 kg)    SpO2 97%    BMI 23.75 kg/m  Wt Readings from Last 3 Encounters:  05/17/21 134 lb 1.3 oz (60.8 kg)  08/16/20 132 lb 11.2 oz (60.2 kg)  05/18/18 134 lb (60.8 kg)    Health Maintenance Due  Topic Date Due   PAP SMEAR-Modifier  04/21/2019   COVID-19 Vaccine (4 - Booster for Moderna series) 05/30/2020   Pneumonia Vaccine 3065+ Years old (1 - PCV) Never done   INFLUENZA VACCINE  12/11/2020    There are no preventive care reminders to display for this patient.   Lab Results  Component Value Date   TSH 2.451 03/14/2015   Lab Results  Component Value Date   WBC 6.8 08/18/2020   HGB 12.9 08/18/2020   HCT 39.6 08/18/2020   MCV 90.0 08/18/2020   PLT 247 08/18/2020   Lab Results  Component Value Date   NA 139 08/18/2020   K 4.0 08/18/2020   CO2 27 08/18/2020   GLUCOSE 90 08/18/2020   BUN 12 08/18/2020   CREATININE 0.72 08/18/2020   BILITOT 0.7 08/18/2020   ALKPHOS 47 06/04/2016   AST 25 08/18/2020   ALT 17 08/18/2020   PROT 7.6 08/18/2020   ALBUMIN 4.0 06/04/2016   CALCIUM 9.0 08/18/2020   Lab Results  Component Value Date   CHOL 200 (H) 08/18/2020   Lab Results  Component Value Date   HDL 55 08/18/2020   Lab Results  Component Value Date   LDLCALC 124 (H) 08/18/2020   Lab Results  Component Value Date   TRIG 99 08/18/2020   Lab Results  Component Value Date   CHOLHDL 3.6 08/18/2020   No results found for: HGBA1C     Assessment & Plan:   1. Feeling of chest tightness 2. Epigastric pain EKG was normal Adding Pepcid Continue your other reflux medicine (lansoprazole) Referral to GI to further evaluate cause of your symptoms   - EKG 12-Lead - famotidine (PEPCID) 20 MG tablet; Take 1 tablet (20 mg total) by mouth at bedtime.  Dispense: 30 tablet; Refill: 2 - Ambulatory referral to Gastroenterology  3. Labial lesion Swab and blood work today - we will let you know results.  For now,  apply warm compresses when lesions develop 2-3 times per day. Be sure it is not too hot to cause a burn.  Keep area clean. Avoid harsh soaps/shampoos  - HSV(herpes simplex vrs) 1+2 ab-IgG - SureSwab HSV, Type 1/2 DNA, PCR  4. Mass of upper outer quadrant of left breast - MM Digital Diagnostic Unilat L; Future   Patient aware of signs/symptoms requiring further/urgent evaluation.   Follow-up in 2 weeks for BP check d/t elevated reading in office today.   Lollie Marrowaylor B. Reola CalkinsBeck, DNP, FNP-C   I spent >32 minutes dedicated to the care of this patient on the date of this encounter to include pre-visit chart review of prior notes and results, face-to-face time with the patient, and post-visit ordering of testing as indicated.

## 2021-05-17 NOTE — Patient Instructions (Signed)
For breast lump: Mammogram ordered  For labial lesion: Swab and blood work today - we will let you know results.  For now, apply warm compresses when lesions develop 2-3 times per day. Be sure it is not too hot to cause a burn.  Keep area clean. Avoid harsh soaps/shampoos  For chest/epigastric discomfort: EKG was normal Adding Pepcid Continue your other reflux medicine (lansoprazole) Referral to GI to further evaluate cause of your symptoms

## 2021-05-18 LAB — HSV(HERPES SIMPLEX VRS) I + II AB-IGG
HAV 1 IGG,TYPE SPECIFIC AB: 45.4 index — ABNORMAL HIGH
HSV 2 IGG,TYPE SPECIFIC AB: 5.78 index — ABNORMAL HIGH

## 2021-05-18 MED ORDER — VALACYCLOVIR HCL 1 G PO TABS: 1000.0000 mg | ORAL_TABLET | Freq: Two times a day (BID) | ORAL | 0 refills | Status: AC

## 2021-05-18 NOTE — Addendum Note (Signed)
Addended by: Hyman Hopes B on: 05/18/2021 05:01 PM   Modules accepted: Orders

## 2021-05-21 LAB — SURESWAB HSV, TYPE 1/2 DNA, PCR
HSV 1 DNA: NOT DETECTED
HSV 2 DNA: NOT DETECTED

## 2021-05-31 ENCOUNTER — Other Ambulatory Visit: Payer: Self-pay

## 2021-05-31 ENCOUNTER — Ambulatory Visit (INDEPENDENT_AMBULATORY_CARE_PROVIDER_SITE_OTHER): Payer: 59 | Admitting: Medical-Surgical

## 2021-05-31 VITALS — BP 125/75 | HR 88 | Resp 20

## 2021-05-31 DIAGNOSIS — I10 Essential (primary) hypertension: Secondary | ICD-10-CM | POA: Diagnosis not present

## 2021-05-31 NOTE — Progress Notes (Signed)
Pt advised of 3 month follow up.

## 2021-05-31 NOTE — Progress Notes (Signed)
Blood pressure looks great.  Return for follow-up in 3 months.  ___________________________________________ Thayer Ohm, DNP, APRN, FNP-BC Primary Care and Sports Medicine [facility] [place]

## 2021-05-31 NOTE — Progress Notes (Signed)
Established Patient Office Visit  Subjective:  Patient ID: Erin Nielsen, female    DOB: 02-11-1956  Age: 66 y.o. MRN: [id number]  CC:  Chief Complaint  Patient presents with   Hypertension    HPI Avalei Beld presents for a BP check. Pt denies chest pain, shortness of breath, dizziness, or heart palpitations.  Taking meds as directed w/o problems.  Denies medication side effects. First BP 134/73, after 10 minutes BP is 125/75.  Past Medical History:  Diagnosis Date   Arthritis    Dizziness    Hyperlipidemia     Past Surgical History:  Procedure Laterality Date   BREAST EXCISIONAL BIOPSY Right 10/2017   COLONOSCOPY N/A 03/30/2015   Procedure: COLONOSCOPY;  Surgeon: Daneil Dolin, MD;  [facility];  Service: Endoscopy;  Laterality: N/A;  1430 - pt can't come earlier/lives far away   ESOPHAGOGASTRODUODENOSCOPY N/A 03/30/2015   Procedure: ESOPHAGOGASTRODUODENOSCOPY (EGD);  Surgeon: Daneil Dolin, MD;  [facility];  Service: Endoscopy;  Laterality: N/A;    Family History  Problem Relation Age of Onset   Tongue cancer Brother    Colon cancer Neg Hx     Social History   Socioeconomic History   Marital status: Married    Spouse name: Not on file   Number of children: 1   Years of education: Not on file   Highest education level: Not on file  Occupational History   Not on file  Tobacco Use   Smoking status: Never   Smokeless tobacco: Never  Vaping Use   Vaping Use: Never used  Substance and Sexual Activity   Alcohol use: No   Drug use: Never   Sexual activity: Not on file  Other Topics Concern   Not on file  Social History Narrative   Not on file   Social Determinants of Health   Financial Resource Strain: Not on file  Food Insecurity: Not on file  Transportation Needs: Not on file  Physical Activity: Not on file  Stress: Not on file  Social Connections: Not on file  Intimate Partner Violence: Not on file    Outpatient  Medications Prior to Visit  Medication Sig Dispense Refill   atorvastatin (LIPITOR) 20 MG tablet Take 1 tablet (20 mg total) by mouth daily. 90 tablet 3   diclofenac Sodium (VOLTAREN) 1 % GEL Apply 4 g topically 4 (four) times daily. To affected joint. 100 g 11   famotidine (PEPCID) 20 MG tablet Take 1 tablet (20 mg total) by mouth at bedtime. 30 tablet 2   hydrocortisone 2.5 % ointment Apply to affected area BID. 30 g 3   lansoprazole (PREVACID) 30 MG capsule TAKE ONE CAPSULE BY MOUTH DAILY AT 12 NOON 30 capsule 5   silver sulfADIAZINE (SILVADENE) 1 % cream Apply 1 application topically daily. 50 g 0   valACYclovir (VALTREX) 1000 MG tablet Take 2 tablets by mouth. TWICE DAILY FOR 1 DAY AS NEEDED FOR COLD SORES     No facility-administered medications prior to visit.    Allergies  Allergen Reactions   Ibuprofen     Other reaction(s): Dizziness (intolerance)    ROS Review of Systems    Objective:    Physical Exam  BP 134/73 (BP Location: Left Arm, Patient Position: Sitting, Cuff Size: Normal)    Pulse 88    Resp 20    SpO2 97%  Wt Readings from Last 3 Encounters:  05/17/21 134 lb 1.3 oz (60.8 kg)  08/16/20  132 lb 11.2 oz (60.2 kg)  05/18/18 134 lb (60.8 kg)     Health Maintenance Due  Topic Date Due   PAP SMEAR-Modifier  04/21/2019   COVID-19 Vaccine (4 - Booster for Moderna series) 05/30/2020   Pneumonia Vaccine 29+ Years old (1 - PCV) Never done   INFLUENZA VACCINE  12/11/2020    There are no preventive care reminders to display for this patient.  Lab Results  Component Value Date   TSH 2.451 03/14/2015   Lab Results  Component Value Date   WBC 6.8 08/18/2020   HGB 12.9 08/18/2020   HCT 39.6 08/18/2020   MCV 90.0 08/18/2020   PLT 247 08/18/2020   Lab Results  Component Value Date   NA 139 08/18/2020   K 4.0 08/18/2020   CO2 27 08/18/2020   GLUCOSE 90 08/18/2020   BUN 12 08/18/2020   CREATININE 0.72 08/18/2020   BILITOT 0.7 08/18/2020   ALKPHOS 47  06/04/2016   AST 25 08/18/2020   ALT 17 08/18/2020   PROT 7.6 08/18/2020   ALBUMIN 4.0 06/04/2016   CALCIUM 9.0 08/18/2020   Lab Results  Component Value Date   CHOL 200 (H) 08/18/2020   Lab Results  Component Value Date   HDL 55 08/18/2020   Lab Results  Component Value Date   LDLCALC 124 (H) 08/18/2020   Lab Results  Component Value Date   TRIG 99 08/18/2020   Lab Results  Component Value Date   CHOLHDL 3.6 08/18/2020   No results found for: HGBA1C    Assessment & Plan:  First BP 134/73, after 10 minutes BP is 125/75. Pt will continue current medications and follow up as needed.  Problem List Items Addressed This Visit       Cardiovascular and Mediastinum   HTN (hypertension) - Primary (Chronic)    No orders of the defined types were placed in this encounter.   Follow-up: Return for as needed.Ninfa Meeker, CMA

## 2021-06-01 ENCOUNTER — Other Ambulatory Visit: Payer: Self-pay | Admitting: Family Medicine

## 2021-06-01 DIAGNOSIS — N6321 Unspecified lump in the left breast, upper outer quadrant: Secondary | ICD-10-CM

## 2021-07-03 ENCOUNTER — Other Ambulatory Visit: Payer: Self-pay | Admitting: Medical-Surgical

## 2021-07-03 DIAGNOSIS — N6321 Unspecified lump in the left breast, upper outer quadrant: Secondary | ICD-10-CM

## 2021-07-04 ENCOUNTER — Encounter: Payer: Self-pay | Admitting: Medical-Surgical

## 2021-07-04 DIAGNOSIS — N6321 Unspecified lump in the left breast, upper outer quadrant: Secondary | ICD-10-CM

## 2021-07-05 ENCOUNTER — Other Ambulatory Visit: Payer: 59

## 2021-07-11 ENCOUNTER — Encounter: Payer: Self-pay | Admitting: Medical-Surgical

## 2021-07-12 ENCOUNTER — Encounter: Payer: Self-pay | Admitting: Medical-Surgical

## 2021-07-12 ENCOUNTER — Telehealth: Payer: Self-pay

## 2021-07-12 NOTE — Telephone Encounter (Signed)
Patient called about her referral that was placed to [facility]. She contacted [facility] and they stated that the referral wasn't received.  ?

## 2021-07-31 LAB — HM MAMMOGRAPHY

## 2021-08-06 ENCOUNTER — Telehealth: Payer: Self-pay

## 2021-08-06 NOTE — Telephone Encounter (Signed)
[facility] called and states Erin Nielsen is scheduled for tomorrow for a left breast biopsy. They need an order to be faxed to them. ? ?Fax - [phone] ?

## 2021-08-07 LAB — HM MAMMOGRAPHY

## 2021-08-07 NOTE — Telephone Encounter (Signed)
Faxed to [facility].  

## 2021-08-10 ENCOUNTER — Encounter: Payer: Self-pay | Admitting: Medical-Surgical

## 2021-09-07 ENCOUNTER — Other Ambulatory Visit: Payer: Self-pay | Admitting: Medical-Surgical

## 2021-09-25 ENCOUNTER — Other Ambulatory Visit: Payer: Self-pay | Admitting: Medical-Surgical

## 2021-09-26 ENCOUNTER — Other Ambulatory Visit: Payer: Self-pay | Admitting: Medical-Surgical

## 2021-09-28 ENCOUNTER — Telehealth: Payer: Self-pay | Admitting: Medical-Surgical

## 2021-09-28 MED ORDER — ATORVASTATIN CALCIUM 20 MG PO TABS
20.0000 mg | ORAL_TABLET | Freq: Every day | ORAL | 0 refills | Status: DC
  Filled 2021-09-28 – 2021-11-06 (×2): qty 90, 90d supply, fill #0

## 2021-09-28 NOTE — Telephone Encounter (Signed)
Refill sent.  Patient needs to keep her appointment for further refills.

## 2021-09-28 NOTE — Telephone Encounter (Signed)
Patient daughter called and scheduled a med refill appt for 7/7 but patient will be out of her atorvastatin before then. Can a refill be sent to last until her appt.

## 2021-09-29 ENCOUNTER — Other Ambulatory Visit: Payer: Self-pay

## 2021-10-01 ENCOUNTER — Other Ambulatory Visit: Payer: Self-pay

## 2021-10-01 NOTE — Telephone Encounter (Signed)
[address] [employer] [place] ID:  [id number]:  LVM for pt that RX was sent to the pharmacy and to keep appt in July for further refills.  Tiajuana Amass, CMA

## 2021-10-03 ENCOUNTER — Encounter: Payer: Self-pay | Admitting: Medical-Surgical

## 2021-10-28 ENCOUNTER — Other Ambulatory Visit: Payer: Self-pay | Admitting: Medical-Surgical

## 2021-11-06 ENCOUNTER — Other Ambulatory Visit: Payer: Self-pay | Admitting: Medical-Surgical

## 2021-11-06 ENCOUNTER — Other Ambulatory Visit (HOSPITAL_COMMUNITY): Payer: Self-pay

## 2021-11-16 ENCOUNTER — Encounter: Payer: Self-pay | Admitting: Medical-Surgical

## 2021-11-16 ENCOUNTER — Ambulatory Visit (INDEPENDENT_AMBULATORY_CARE_PROVIDER_SITE_OTHER): Payer: 59

## 2021-11-16 ENCOUNTER — Ambulatory Visit (INDEPENDENT_AMBULATORY_CARE_PROVIDER_SITE_OTHER): Payer: 59 | Admitting: Medical-Surgical

## 2021-11-16 VITALS — BP 158/72 | HR 57 | Resp 20 | Ht 63.0 in | Wt 129.1 lb

## 2021-11-16 DIAGNOSIS — B001 Herpesviral vesicular dermatitis: Secondary | ICD-10-CM | POA: Diagnosis not present

## 2021-11-16 DIAGNOSIS — M25561 Pain in right knee: Secondary | ICD-10-CM

## 2021-11-16 DIAGNOSIS — I10 Essential (primary) hypertension: Secondary | ICD-10-CM

## 2021-11-16 DIAGNOSIS — E7800 Pure hypercholesterolemia, unspecified: Secondary | ICD-10-CM | POA: Diagnosis not present

## 2021-11-16 DIAGNOSIS — R1013 Epigastric pain: Secondary | ICD-10-CM

## 2021-11-16 DIAGNOSIS — K219 Gastro-esophageal reflux disease without esophagitis: Secondary | ICD-10-CM

## 2021-11-16 DIAGNOSIS — R252 Cramp and spasm: Secondary | ICD-10-CM

## 2021-11-16 DIAGNOSIS — G8929 Other chronic pain: Secondary | ICD-10-CM

## 2021-11-16 MED ORDER — VALACYCLOVIR HCL 500 MG PO TABS: 500.0000 mg | ORAL_TABLET | Freq: Every day | ORAL | 3 refills | Status: DC

## 2021-11-16 MED ORDER — LANSOPRAZOLE 30 MG PO CPDR
DELAYED_RELEASE_CAPSULE | ORAL | 3 refills | Status: DC
Start: 2021-11-16 — End: 2022-07-19

## 2021-11-16 MED ORDER — FAMOTIDINE 20 MG PO TABS: 20.0000 mg | ORAL_TABLET | Freq: Every day | ORAL | 2 refills | Status: DC

## 2021-11-16 MED ORDER — VALACYCLOVIR HCL 500 MG PO TABS
500.0000 mg | ORAL_TABLET | Freq: Every day | ORAL | 3 refills | Status: DC
Start: 2021-11-16 — End: 2021-11-16

## 2021-11-16 MED ORDER — ATORVASTATIN CALCIUM 20 MG PO TABS: 20.0000 mg | ORAL_TABLET | Freq: Every day | ORAL | 0 refills | Status: DC

## 2021-11-16 MED ORDER — ATORVASTATIN CALCIUM 20 MG PO TABS: 20.0000 mg | ORAL_TABLET | Freq: Every day | ORAL | 3 refills | Status: DC

## 2021-11-16 MED ORDER — FAMOTIDINE 20 MG PO TABS: 20.0000 mg | ORAL_TABLET | Freq: Every day | ORAL | 3 refills | Status: DC

## 2021-11-16 MED ORDER — LANSOPRAZOLE 30 MG PO CPDR
DELAYED_RELEASE_CAPSULE | ORAL | 5 refills | Status: DC
Start: 2021-11-16 — End: 2021-11-16

## 2021-11-16 NOTE — Progress Notes (Signed)
Established Patient Office Visit  Subjective   Patient ID: Erin Nielsen, female   DOB: 01-Feb-1956 Age: 66 y.o. MRN: [id number]   Chief Complaint  Patient presents with   Medication Refill    HPI [employer] served as in person professional interpreter.   Pleasant 66 year old female presenting today for follow-up on:  Hypertension: Checks blood pressure at home but not all the time.  Was previously checking prior to starting cholesterol meds and noted that her home readings were 120s/70s.  She does have a cuff but has not been as studious regarding using it.Denies CP, SOB, palpitations, lower extremity edema, dizziness, headaches, or vision changes.  Hyperlipidemia: Taking Lipitor 20 mg daily, tolerating well without side effects.  Recurrent cold sores: Notes that she has been having very frequent outbreaks of cold sores and genital blisters.  Previously taking Valtrex on an as-needed basis.  Has not taken this for prevention/prophylaxis before.  He is interested in this option due to the frequency of her outbreaks.  GERD: Taking Prevacid in the morning and Pepcid at night.  Notes that this works very well for her and is keeping her reflux symptoms in check.  Requesting refills.  Has had some issues with right knee pain that seems to be associated with right calf cramping.  This tends to happen at night and the pains tend to happen together.  Most recently has had about 1 week of nightly symptoms that occurred last month.  Symptoms have not returned since.   Objective:    Vitals:   11/16/21 0936 11/16/21 1033  BP: (!) 151/82 (!) 158/72  Pulse: (!) 59 (!) 57  Resp: 20   Height: 5\' 3"  (1.6 m)   Weight: 129 lb 1.9 oz (58.6 kg)   SpO2: 98%   BMI (Calculated): 22.88     Physical Exam Constitutional:      General: She is not in acute distress.    Appearance: Normal appearance. She is normal weight. She is not ill-appearing, toxic-appearing or diaphoretic.  HENT:     Head:  Normocephalic and atraumatic.  Eyes:     General: No scleral icterus.       Right eye: No discharge.        Left eye: No discharge.     Extraocular Movements: Extraocular movements intact.     Conjunctiva/sclera: Conjunctivae normal.     Pupils: Pupils are equal, round, and reactive to light.  Cardiovascular:     Rate and Rhythm: Normal rate and regular rhythm.     Pulses: Normal pulses.     Heart sounds: Normal heart sounds. No murmur heard.    No friction rub. No gallop.  Pulmonary:     Effort: Pulmonary effort is normal. No respiratory distress.     Breath sounds: Normal breath sounds.  Skin:    General: Skin is warm and dry.  Neurological:     Mental Status: She is alert.  Psychiatric:        Mood and Affect: Mood normal.        Behavior: Behavior normal.        Thought Content: Thought content normal.        Judgment: Judgment normal.   No results found for this or any previous visit (from the past 24 hour(s)).     The 10-year ASCVD risk score (Arnett DK, et al., 2019) is: 9.2%   Values used to calculate the score:     Age: 82 years     Sex:  Female     Is Non-Hispanic African American: No     Diabetic: No     Tobacco smoker: No     Systolic Blood Pressure: 158 mmHg     Is BP treated: No     HDL Cholesterol: 55 mg/dL     Total Cholesterol: 200 mg/dL   Assessment & Plan:   1. Primary hypertension Checking labs as below.  Suspect there is a bit of whitecoat hypertension that is contributing to her elevated blood pressures here in our office.  Recommend restarting home monitoring with a goal of 130/80 or less.  Check blood pressure daily and keep this on log.  If pressures continue to run greater than 130/80, return for further discussion and management.   - COMPLETE METABOLIC PANEL WITH GFR - Lipid panel - CBC  2. Gastroesophageal reflux disease, unspecified whether esophagitis present Stable.  Continue Prevacid and Pepcid as prescribed.  3. Recurrent cold  sores Due to the frequency of her outbreaks, we are going to switch to suppressive therapy with Valtrex 500 mg daily.  If this does not keep her outbreaks controlled, we can increase to 1000 mg daily.  4. Pure hypercholesterolemia Checking labs as below.  Continue atorvastatin 20 mg daily. - COMPLETE METABOLIC PANEL WITH GFR - Lipid panel - atorvastatin (LIPITOR) 20 MG tablet; Take 1 tablet (20 mg total) by mouth daily.  Dispense: 90 tablet; Refill: 3  5. Epigastric pain Continue famotidine as prescribed - famotidine (PEPCID) 20 MG tablet; Take 1 tablet (20 mg total) by mouth at bedtime.  Dispense: 90 tablet; Refill: 3  6. Chronic pain of right knee X-rays today.  Recommend regular intentional activity as well as anti-inflammatories as needed.  Okay to use topical analgesics if desired - DG Knee Complete 4 Views Right; Future  7. Muscle cramps at night Checking labs as below.  This seems to have resolved however she will continue monitoring for any return of symptoms so that we can evaluate further. - COMPLETE METABOLIC PANEL WITH GFR - CBC - Magnesium - TSH  Return in about 6 months (around 05/19/2022) for HTN/HLD/cold sores/GERD follow up.  ___________________________________________ Thayer Ohm, DNP, APRN, FNP-BC Primary Care and Sports Medicine [facility]

## 2021-11-17 LAB — COMPLETE METABOLIC PANEL WITH GFR
AG Ratio: 1.3 (calc) (ref 1.0–2.5)
ALT: 23 U/L (ref 6–29)
AST: 30 U/L (ref 10–35)
Albumin: 4.3 g/dL (ref 3.6–5.1)
Alkaline phosphatase (APISO): 54 U/L (ref 37–153)
BUN: 9 mg/dL (ref 7–25)
CO2: 27 mmol/L (ref 20–32)
Calcium: 9.1 mg/dL (ref 8.6–10.4)
Chloride: 106 mmol/L (ref 98–110)
Creat: 0.74 mg/dL (ref 0.50–1.05)
Globulin: 3.2 g/dL (calc) (ref 1.9–3.7)
Glucose, Bld: 88 mg/dL (ref 65–99)
Potassium: 4.3 mmol/L (ref 3.5–5.3)
Sodium: 140 mmol/L (ref 135–146)
Total Bilirubin: 0.7 mg/dL (ref 0.2–1.2)
Total Protein: 7.5 g/dL (ref 6.1–8.1)
eGFR: 89 mL/min/{1.73_m2} (ref >=60)

## 2021-11-17 LAB — CBC
HCT: 39 % (ref 35.0–45.0)
Hemoglobin: 12.9 g/dL (ref 11.7–15.5)
MCH: 30.3 pg (ref 27.0–33.0)
MCHC: 33.1 g/dL (ref 32.0–36.0)
MCV: 91.5 fL (ref 80.0–100.0)
MPV: 8.7 fL (ref 7.5–12.5)
Platelets: 247 10*3/uL (ref 140–400)
RBC: 4.26 10*6/uL (ref 3.80–5.10)
RDW: 11.9 % (ref 11.0–15.0)
WBC: 5.2 10*3/uL (ref 3.8–10.8)

## 2021-11-17 LAB — LIPID PANEL
Cholesterol: 126 mg/dL (ref <200)
HDL: 50 mg/dL (ref >=50)
LDL Cholesterol (Calc): 58 mg/dL (calc)
Non-HDL Cholesterol (Calc): 76 mg/dL (calc) (ref <130)
Total CHOL/HDL Ratio: 2.5 (calc) (ref <5.0)
Triglycerides: 97 mg/dL (ref <150)

## 2021-11-17 LAB — TSH: TSH: 2.57 mIU/L (ref 0.40–4.50)

## 2021-11-17 LAB — MAGNESIUM: Magnesium: 2.2 mg/dL (ref 1.5–2.5)

## 2021-12-05 ENCOUNTER — Encounter: Payer: Self-pay | Admitting: Medical-Surgical

## 2021-12-07 ENCOUNTER — Encounter: Payer: Self-pay | Admitting: Medical-Surgical

## 2021-12-07 ENCOUNTER — Ambulatory Visit (INDEPENDENT_AMBULATORY_CARE_PROVIDER_SITE_OTHER): Payer: 59 | Admitting: Medical-Surgical

## 2021-12-07 ENCOUNTER — Other Ambulatory Visit: Payer: Self-pay

## 2021-12-07 ENCOUNTER — Other Ambulatory Visit (HOSPITAL_BASED_OUTPATIENT_CLINIC_OR_DEPARTMENT_OTHER): Payer: Self-pay

## 2021-12-07 VITALS — BP 157/71 | HR 75 | Resp 20 | Ht 63.0 in | Wt 133.1 lb

## 2021-12-07 DIAGNOSIS — I10 Essential (primary) hypertension: Secondary | ICD-10-CM

## 2021-12-07 DIAGNOSIS — R0789 Other chest pain: Secondary | ICD-10-CM | POA: Diagnosis not present

## 2021-12-07 MED ORDER — METOPROLOL SUCCINATE ER 25 MG PO TB24
25.0000 mg | ORAL_TABLET | Freq: Every day | ORAL | 3 refills | Status: DC
  Filled 2021-12-07 – 2021-12-08 (×2): qty 90, 90d supply, fill #0
  Filled 2022-03-09 – 2022-04-23 (×2): qty 90, 90d supply, fill #1

## 2021-12-07 NOTE — Progress Notes (Signed)
Established Patient Office Visit  Subjective   Patient ID: Erin Nielsen, female   DOB: 1956-04-10 Age: 66 y.o. MRN: [id number]   Chief Complaint  Patient presents with   Hypertension    HPI  Professional interpreter utilized via video service [phone] [facility] 66 year old female presenting today for discussion of blood pressure. Has been monitoring BP at home with readings from 127-148/70-80s. Notes that it gets higher when she is angry or upset. She has never been on blood pressure medications before. Follows a low sodium diet. Reports exercising doing walking and occasional swimming or strength exercises every week during the summer but less in the winter. When swimming, has some tightness in her chest.  Endorses intermittent headaches and vertigo/dizziness.  Notes that she did have chest pain on 2 separate occasions last year but has not had any recently.  Denies syncope, shortness of breath, vision changes, and lower extremity edema.  + intermittent HA + CP x 2 last year but not recently + vertigo - syncope - SOB - vision changes - lower extremity edema   Objective:    Vitals:   12/07/21 1611  BP: (!) 157/71  Pulse: 75  Resp: 20  Height: 5\' 3"  (1.6 m)  Weight: 133 lb 1.6 oz (60.4 kg)  SpO2: 96%  BMI (Calculated): 23.58   Physical Exam Vitals and nursing note reviewed.  Constitutional:      General: She is not in acute distress.    Appearance: Normal appearance. She is not ill-appearing.  HENT:     Head: Normocephalic and atraumatic.  Cardiovascular:     Rate and Rhythm: Normal rate and regular rhythm.     Pulses: Normal pulses.     Heart sounds: Normal heart sounds.  Pulmonary:     Effort: Pulmonary effort is normal. No respiratory distress.     Breath sounds: Normal breath sounds. No wheezing, rhonchi or rales.  Skin:    General: Skin is warm and dry.  Neurological:     Mental Status: She is alert and oriented to person, place, and time.  Psychiatric:         Mood and Affect: Mood normal.        Behavior: Behavior normal.        Thought Content: Thought content normal.        Judgment: Judgment normal.   No results found for this or any previous visit (from the past 24 hour(s)).     The ASCVD Risk score (Arnett DK, et al., 2019) failed to calculate for the following reasons:   The valid total cholesterol range is 130 to 320 mg/dL   Assessment & Plan:   1. Chest pressure Chest pressure with activity slightly concerning.  This could be just poor physical endurance however we do need to make sure we are ruling out all the issues.  EKG in January was reassuring with normal sinus rhythm.  She has not had an echocardiogram so we will go ahead and order that today for further evaluation. - ECHOCARDIOGRAM COMPLETE; Future  2. Primary hypertension Checking echocardiogram as above.  Home readings are borderline in the 130s over 80s however she is a bit higher today.  She seems significantly concerned by her blood pressure readings and is interested in a medication so we will go ahead and start with Toprol-XL 25 mg once daily. - ECHOCARDIOGRAM COMPLETE; Future - metoprolol succinate (TOPROL-XL) 25 MG 24 hr tablet; Take 1 tablet (25 mg total) by mouth daily.  Dispense: 90 tablet; Refill: 3  Return in about 2 weeks (around 12/21/2021) for nurse visit for BP check.  ___________________________________________ Thayer Ohm, DNP, APRN, FNP-BC Primary Care and Sports Medicine [facility] [place]

## 2021-12-08 ENCOUNTER — Other Ambulatory Visit (HOSPITAL_COMMUNITY): Payer: Self-pay

## 2021-12-21 ENCOUNTER — Ambulatory Visit (INDEPENDENT_AMBULATORY_CARE_PROVIDER_SITE_OTHER): Payer: 59 | Admitting: Medical-Surgical

## 2021-12-21 VITALS — BP 147/83 | HR 61 | Temp 98.8°F | Ht 63.0 in | Wt 133.1 lb

## 2021-12-21 DIAGNOSIS — I10 Essential (primary) hypertension: Secondary | ICD-10-CM

## 2021-12-21 DIAGNOSIS — M545 Low back pain, unspecified: Secondary | ICD-10-CM | POA: Diagnosis not present

## 2021-12-21 MED ORDER — PREDNISONE 20 MG PO TABS
20.0000 mg | ORAL_TABLET | Freq: Every day | ORAL | 0 refills | Status: DC
  Filled 2021-12-21: qty 5, 5d supply, fill #0

## 2021-12-21 NOTE — Progress Notes (Signed)
Agree with documentation as below.  Prednisone burst prescription sent to the pharmacy as requested.  ___________________________________________ Thayer Ohm, DNP, APRN, FNP-BC Primary Care and Sports Medicine [facility] [place]

## 2021-12-21 NOTE — Progress Notes (Signed)
Patient is here for blood pressure check. Denies trouble sleeping, palpitations, dizziness, lightheadedness, blurry vision, chest pain, shortness of breath, headaches and/or medication problems.   Patient's blood pressure was out of goal range. Per patient, starting having lower back pain earlier this morning. It hurts to sit/stand and move around. Patient is not taking any oral pain medication. She is only using pain patches with little relief. Patient sat for 15 minutes. Blood pressure recheck was not at goal. Provider notified of current blood pressure readings. Patient did bring in a copy of current home readings. Placed in provider's bin.   Patient informed to continue current medications and to monitor her blood pressure at home. She is aware to stop by the pharmacy for the steroid burst rx for her back pain. Verbalized understanding to contact the clinic if she is not feeling better. Rx pended. Patient informed to schedule next [facility] appointment in two weeks.

## 2021-12-24 ENCOUNTER — Other Ambulatory Visit: Payer: Self-pay

## 2021-12-24 NOTE — Telephone Encounter (Signed)
Left message for a return call to schedule an appointment with Dr T.

## 2021-12-24 NOTE — Telephone Encounter (Signed)
Please contact patient and schedule with Dr. Karie Schwalbe for further evaluation.

## 2021-12-26 ENCOUNTER — Ambulatory Visit (INDEPENDENT_AMBULATORY_CARE_PROVIDER_SITE_OTHER): Payer: Medicaid Other | Admitting: Family Medicine

## 2021-12-26 VITALS — BP 127/70 | HR 67 | Ht 63.0 in | Wt 134.0 lb

## 2021-12-26 DIAGNOSIS — G514 Facial myokymia: Secondary | ICD-10-CM

## 2021-12-26 DIAGNOSIS — M545 Low back pain, unspecified: Secondary | ICD-10-CM | POA: Insufficient documentation

## 2021-12-26 MED ORDER — PREDNISONE 20 MG PO TABS: 20.0000 mg | ORAL_TABLET | Freq: Every day | ORAL | 0 refills | Status: DC

## 2021-12-26 MED ORDER — METHOCARBAMOL 500 MG PO TABS: 250.0000 mg | ORAL_TABLET | Freq: Two times a day (BID) | ORAL | 0 refills | Status: DC | PRN

## 2021-12-26 NOTE — Assessment & Plan Note (Signed)
-   neuro exam normal no abnormalities seen in CN  - no twitching seen on exam - will order CMP to look at electrolytes and see if there are any abnormalities

## 2021-12-26 NOTE — Progress Notes (Signed)
Acute Office Visit  Subjective:     Patient ID: Erin Nielsen, female    DOB: 1956-01-06, 66 y.o.   MRN: [id number]  Chief Complaint  Patient presents with   Back Pain    Interpreter ; Leigh Aurora #[id number]    HPI Patient is in today for low back pain. She was recently seen as a nurse visit in clinic 5 days ago for a blood pressure check. Blood pressure was noted to be elevated at that time and patient endorsed low back pain. She was given a prescription for steroids for her back pain by her PCP. She is feeling a bit better since she was here for her nurse visit last week. She localizes back pain to her low back above her tailbone. She admits to some numbness on the lateral aspect of her left leg near her IT band. She says the voltaren gel has given her temporary relief. She has been wearing a brace around her hips and when she was wearing the brace she noticed some numbness and tingling on the left leg that has resolved since she stopped wearing the back brace. She denies trauma, no injuries, she has had back pain for the past two years. She says bending forward exacerbates pain.   Pt also notes twitching on the left side of her upper lip that she has noticed for the past few weeks. She says flattening the crease will stop the twitching for a while. She thought it was related to her sleeping position but has not had a correlation   Review of Systems  Constitutional:  Negative for chills and fever.  Respiratory:  Negative for cough and shortness of breath.   Cardiovascular:  Negative for chest pain.  Musculoskeletal:  Positive for back pain.  Neurological:  Negative for headaches.        Objective:    BP 127/70   Pulse 67   Ht 5\' 3"  (1.6 m)   Wt 134 lb (60.8 kg)   BMI 23.74 kg/m    Physical Exam Vitals and nursing note reviewed.  Constitutional:      General: She is not in acute distress.    Appearance: Normal appearance.  HENT:     Head: Normocephalic and atraumatic.     Right  Ear: External ear normal.     Left Ear: External ear normal.     Nose: Nose normal.  Eyes:     Conjunctiva/sclera: Conjunctivae normal.  Cardiovascular:     Rate and Rhythm: Normal rate and regular rhythm.  Pulmonary:     Effort: Pulmonary effort is normal.     Breath sounds: Normal breath sounds.  Musculoskeletal:     Comments: Non-tender to palpation of T, L and sacrum.  No pain with rotation or extension. Some pain with forward bending.   Neurological:     General: No focal deficit present.     Mental Status: She is alert and oriented to person, place, and time.     Comments: CN II-XII intact. No facial twitching noted on exam.   Psychiatric:        Mood and Affect: Mood normal.        Behavior: Behavior normal.        Thought Content: Thought content normal.        Judgment: Judgment normal.     No results found for any visits on 12/26/21.      Assessment & Plan:   Problem List Items Addressed This Visit  Other   Acute low back pain without sciatica - Primary    - likely msk in nature - pt said she did not get the steroids from [facility] and would like them sent in again - will give muscle relaxer  - provided pt with low back exercises - pt would not like referral for PT today  - if no better in 4 weeks can consider L5/S1 xray      Facial twitching    - neuro exam normal no abnormalities seen in CN  - no twitching seen on exam - will order CMP to look at electrolytes and see if there are any abnormalities       No orders of the defined types were placed in this encounter.  Use heating pad for on and off.  Back stretches    Return in about 4 weeks (around 01/23/2022) for with [facility] .  Charlton Amor, DO

## 2021-12-26 NOTE — Assessment & Plan Note (Addendum)
-   likely msk in nature - pt said she did not get the steroids from [facility] and would like them sent in again - will give muscle relaxer  - provided pt with low back exercises - pt would not like referral for PT today  - if no better in 4 weeks can consider L5/S1 xray

## 2021-12-27 ENCOUNTER — Other Ambulatory Visit: Payer: Self-pay

## 2021-12-27 ENCOUNTER — Other Ambulatory Visit (HOSPITAL_COMMUNITY): Payer: Self-pay

## 2021-12-27 LAB — COMPLETE METABOLIC PANEL WITH GFR
AG Ratio: 1.3 (calc) (ref 1.0–2.5)
ALT: 22 U/L (ref 6–29)
AST: 28 U/L (ref 10–35)
Albumin: 4.2 g/dL (ref 3.6–5.1)
Alkaline phosphatase (APISO): 53 U/L (ref 37–153)
BUN: 10 mg/dL (ref 7–25)
CO2: 27 mmol/L (ref 20–32)
Calcium: 8.8 mg/dL (ref 8.6–10.4)
Chloride: 104 mmol/L (ref 98–110)
Creat: 0.96 mg/dL (ref 0.50–1.05)
Globulin: 3.2 g/dL (calc) (ref 1.9–3.7)
Glucose, Bld: 112 mg/dL — ABNORMAL HIGH (ref 65–99)
Potassium: 4.2 mmol/L (ref 3.5–5.3)
Sodium: 140 mmol/L (ref 135–146)
Total Bilirubin: 0.6 mg/dL (ref 0.2–1.2)
Total Protein: 7.4 g/dL (ref 6.1–8.1)
eGFR: 65 mL/min/{1.73_m2} (ref >=60)

## 2021-12-31 ENCOUNTER — Other Ambulatory Visit: Payer: Self-pay

## 2022-01-01 ENCOUNTER — Other Ambulatory Visit (HOSPITAL_COMMUNITY): Payer: 59

## 2022-01-04 ENCOUNTER — Ambulatory Visit (INDEPENDENT_AMBULATORY_CARE_PROVIDER_SITE_OTHER): Payer: Medicaid Other | Admitting: Sports Medicine

## 2022-01-04 VITALS — BP 136/74 | HR 62 | Resp 20

## 2022-01-04 DIAGNOSIS — I10 Essential (primary) hypertension: Secondary | ICD-10-CM

## 2022-01-04 NOTE — Progress Notes (Signed)
   Subjective:    Patient ID: Erin Nielsen, female    DOB: Aug 22, 1955, 66 y.o.   MRN: [id number]  HPI Patient is here for blood pressure check. Denies trouble sleeping, palpitations, dizziness, lightheadedness, blurry vision, chest pain, shortness of breath, headaches and/or medication problems.    Review of Systems     Objective:   Physical Exam        Assessment & Plan:  Patient informed to continue current medications and to monitor her blood pressure at home.

## 2022-01-04 NOTE — Assessment & Plan Note (Signed)
Pleasant 66 year old female returns, it sounds like she has had some high blood pressures, I reviewed her home log, blood pressures typically range from 120s to 140s, her blood pressure here in the office looks great, no changes in medical regimen.

## 2022-01-11 ENCOUNTER — Ambulatory Visit (HOSPITAL_COMMUNITY): Payer: Medicaid Other | Attending: Medical-Surgical

## 2022-01-11 DIAGNOSIS — R0789 Other chest pain: Secondary | ICD-10-CM | POA: Diagnosis not present

## 2022-01-11 DIAGNOSIS — I10 Essential (primary) hypertension: Secondary | ICD-10-CM | POA: Diagnosis not present

## 2022-01-11 LAB — ECHOCARDIOGRAM COMPLETE
Area-P 1/2: 3.31 cm2
S' Lateral: 2.8 cm

## 2022-01-18 ENCOUNTER — Other Ambulatory Visit (INDEPENDENT_AMBULATORY_CARE_PROVIDER_SITE_OTHER): Payer: Medicaid Other | Admitting: Medical-Surgical

## 2022-01-18 DIAGNOSIS — R21 Rash and other nonspecific skin eruption: Secondary | ICD-10-CM

## 2022-01-18 MED ORDER — TRIAMCINOLONE ACETONIDE 0.1 % EX CREA: TOPICAL_CREAM | CUTANEOUS | 0 refills | Status: DC

## 2022-01-18 NOTE — Progress Notes (Signed)
Presented with her husband for his appointment.  During the appointment, it was noted that she had a red rash on the left upper arm just above the [facility].  Patient reports this was from a bug bite several months ago that was originally very itchy and then resolved.  A couple of days ago, the redness and itchiness came back.  She has not been using any over-the-counter medications.  Unsure if the insect bite was a tick or not and unable to quantify the amount of time that a tick may have been attached in the area.  Low suspicion for tick bite at this point.  With her itchiness and erythema, we will go ahead and treat with triamcinolone topically twice daily for up to 14 days.  ___________________________________________ Thayer Ohm, DNP, APRN, FNP-BC Primary Care and Sports Medicine [facility] [place]

## 2022-01-21 ENCOUNTER — Other Ambulatory Visit: Payer: Self-pay

## 2022-03-09 ENCOUNTER — Encounter: Payer: Self-pay | Admitting: Medical-Surgical

## 2022-03-11 ENCOUNTER — Other Ambulatory Visit (HOSPITAL_COMMUNITY): Payer: Self-pay

## 2022-03-14 ENCOUNTER — Other Ambulatory Visit (HOSPITAL_COMMUNITY): Payer: Self-pay

## 2022-03-14 ENCOUNTER — Encounter (HOSPITAL_COMMUNITY): Payer: Self-pay

## 2022-03-22 ENCOUNTER — Other Ambulatory Visit (HOSPITAL_COMMUNITY): Payer: Self-pay

## 2022-03-29 ENCOUNTER — Other Ambulatory Visit (HOSPITAL_COMMUNITY): Payer: Self-pay

## 2022-04-23 ENCOUNTER — Other Ambulatory Visit (HOSPITAL_COMMUNITY): Payer: Self-pay

## 2022-04-23 ENCOUNTER — Other Ambulatory Visit (HOSPITAL_BASED_OUTPATIENT_CLINIC_OR_DEPARTMENT_OTHER): Payer: Self-pay

## 2022-05-23 ENCOUNTER — Encounter: Payer: Self-pay | Admitting: Medical-Surgical

## 2022-05-23 ENCOUNTER — Ambulatory Visit: Payer: Medicaid Other | Admitting: Medical-Surgical

## 2022-05-23 VITALS — BP 178/75 | HR 66 | Resp 20 | Ht 63.0 in | Wt 132.1 lb

## 2022-05-23 DIAGNOSIS — E7800 Pure hypercholesterolemia, unspecified: Secondary | ICD-10-CM | POA: Diagnosis not present

## 2022-05-23 DIAGNOSIS — I10 Essential (primary) hypertension: Secondary | ICD-10-CM | POA: Diagnosis not present

## 2022-05-23 DIAGNOSIS — J019 Acute sinusitis, unspecified: Secondary | ICD-10-CM | POA: Diagnosis not present

## 2022-05-23 DIAGNOSIS — K219 Gastro-esophageal reflux disease without esophagitis: Secondary | ICD-10-CM | POA: Diagnosis not present

## 2022-05-23 DIAGNOSIS — B001 Herpesviral vesicular dermatitis: Secondary | ICD-10-CM

## 2022-05-23 DIAGNOSIS — B9689 Other specified bacterial agents as the cause of diseases classified elsewhere: Secondary | ICD-10-CM

## 2022-05-23 MED ORDER — GUAIFENESIN-CODEINE 100-10 MG/5ML PO SYRP: 5.0000 mL | ORAL_SOLUTION | Freq: Three times a day (TID) | ORAL | 0 refills | Status: DC | PRN

## 2022-05-23 MED ORDER — METHYLPREDNISOLONE 4 MG PO TBPK: ORAL_TABLET | ORAL | 0 refills | Status: DC

## 2022-05-23 MED ORDER — AZITHROMYCIN 250 MG PO TABS: ORAL_TABLET | ORAL | 0 refills | Status: AC

## 2022-05-23 NOTE — Progress Notes (Signed)
Established Patient Office Visit  Subjective   Patient ID: Erin Nielsen, female   DOB: 09-05-1955 Age: 67 y.o. MRN: [id number]   Chief Complaint  Patient presents with   Cough   HPI Pleasant 67 year old female presenting today for evaluation of upper respiratory symptoms that have been present over the last 17 days.  Reports that her symptoms started out with sinus congestion, headache, and a mild cough.  The cough has progressed and has become severe.  Coughs up thick yellow mucus, worse in the mornings but notes that the cough at night is keeping her awake.  She continues to have frontal and maxillary sinus pressure accompanied by constant headache.  She has tried a couple of home remedies and over-the-counter medications including cough syrup.  These did not provide any relief of her symptoms.  Denies fever, chills, GI symptoms.  Endorses a bit of shortness of breath and chest discomfort when her cough gets severe.  Plans to go to [place] in March and will be gone for 6 months.  Is interested in trying to get her medication refills for a 65-month supply so that she will not run out of them while she is there.  Hyperlipidemia: Taking Lipitor 20 mg daily, tolerating well without side effects.  Following a low-fat heart healthy diet.  Hypertension: Taking Toprol-XL 25 mg daily.  Reports that she checks her blood pressure at home and the most of her readings are very good.  Has had an occasional systolic reading in the 409W but for the most part, it runs at goal. Denies CP, SOB, palpitations, lower extremity edema, dizziness, headaches, or vision changes.  GERD: Taking famotidine 20 mg at bedtime and Prevacid 30 mg every morning.  Tolerating both medications well without side effects.  Feels that the medication is working to help control her reflux.  Cold sores: Taking Valtrex 500 mg daily, tolerating well without side effects.  No recent cold sore outbreaks.   Objective:    Vitals:   05/23/22  0820  BP: (!) 161/77  Pulse: 60  Resp: 20  Height: 5\' 3"  (1.6 m)  Weight: 132 lb 1.3 oz (59.9 kg)  SpO2: 97%  BMI (Calculated): 23.4   Physical Exam Vitals and nursing note reviewed.  Constitutional:      General: She is not in acute distress.    Appearance: Normal appearance. She is not ill-appearing.  HENT:     Head: Normocephalic and atraumatic.  Cardiovascular:     Rate and Rhythm: Normal rate and regular rhythm.     Pulses: Normal pulses.     Heart sounds: Normal heart sounds.  Pulmonary:     Effort: Pulmonary effort is normal. No respiratory distress.     Breath sounds: Normal breath sounds. No wheezing, rhonchi or rales.  Skin:    General: Skin is warm and dry.  Neurological:     Mental Status: She is alert and oriented to person, place, and time.  Psychiatric:        Mood and Affect: Mood normal.        Behavior: Behavior normal.        Thought Content: Thought content normal.        Judgment: Judgment normal.   No results found for this or any previous visit (from the past 24 hour(s)).     The ASCVD Risk score (Arnett DK, et al., 2019) failed to calculate for the following reasons:   The valid total cholesterol range is 130 to 320  mg/dL   Assessment & Plan:   1. Acute bacterial rhinosinusitis Start azithromycin 500 mg today with 250 mg daily x 4 days.  Adding Medrol Dosepak.  Robitussin AC every 8 hours as needed for severe cough.  Advised to monitor for sedation while using this medication.  Okay to continue conservative measures at home.  2. Primary hypertension Blood pressure elevated on arrival, still elevated on recheck.  Home readings have been good so recommend she continue Toprol-XL 25 mg daily.  Monitor blood pressure at home and send the readings to me through MyChart in 2 weeks for evaluation and possible medication changes.  3. Gastroesophageal reflux disease, unspecified whether esophagitis present Stable.  Continue famotidine and Prevacid as  prescribed.  4. Pure hypercholesterolemia Checking labs as below.  Continue atorvastatin 20mg  daily. - COMPLETE METABOLIC PANEL WITH GFR - Lipid panel  5. Recurrent cold sores Continue Valtrex 500 mg daily.  Return in about 8 months (around 01/22/2023) for chronic disease follow up.  ___________________________________________ Clearnce Sorrel, DNP, APRN, FNP-BC Primary Care and [place]

## 2022-05-24 ENCOUNTER — Ambulatory Visit: Payer: 59 | Admitting: Medical-Surgical

## 2022-05-24 LAB — COMPLETE METABOLIC PANEL WITH GFR
AG Ratio: 1.4 (calc) (ref 1.0–2.5)
ALT: 21 U/L (ref 6–29)
AST: 26 U/L (ref 10–35)
Albumin: 4.5 g/dL (ref 3.6–5.1)
Alkaline phosphatase (APISO): 59 U/L (ref 37–153)
BUN: 8 mg/dL (ref 7–25)
CO2: 27 mmol/L (ref 20–32)
Calcium: 9.3 mg/dL (ref 8.6–10.4)
Chloride: 100 mmol/L (ref 98–110)
Creat: 0.67 mg/dL (ref 0.50–1.05)
Globulin: 3.3 g/dL (calc) (ref 1.9–3.7)
Glucose, Bld: 89 mg/dL (ref 65–99)
Potassium: 4.5 mmol/L (ref 3.5–5.3)
Sodium: 136 mmol/L (ref 135–146)
Total Bilirubin: 0.7 mg/dL (ref 0.2–1.2)
Total Protein: 7.8 g/dL (ref 6.1–8.1)
eGFR: 96 mL/min/{1.73_m2} (ref >=60)

## 2022-05-24 LAB — LIPID PANEL
Cholesterol: 136 mg/dL (ref <200)
HDL: 49 mg/dL — ABNORMAL LOW (ref >=50)
LDL Cholesterol (Calc): 67 mg/dL (calc)
Non-HDL Cholesterol (Calc): 87 mg/dL (calc) (ref <130)
Total CHOL/HDL Ratio: 2.8 (calc) (ref <5.0)
Triglycerides: 118 mg/dL (ref <150)

## 2022-06-10 ENCOUNTER — Encounter: Payer: Self-pay | Admitting: Medical-Surgical

## 2022-07-19 ENCOUNTER — Ambulatory Visit (INDEPENDENT_AMBULATORY_CARE_PROVIDER_SITE_OTHER): Payer: Medicaid Other | Admitting: Medical-Surgical

## 2022-07-19 ENCOUNTER — Encounter: Payer: Self-pay | Admitting: Medical-Surgical

## 2022-07-19 ENCOUNTER — Other Ambulatory Visit: Payer: Self-pay | Admitting: Medical-Surgical

## 2022-07-19 VITALS — BP 168/74 | HR 60 | Resp 20 | Ht 63.0 in | Wt 135.5 lb

## 2022-07-19 DIAGNOSIS — B001 Herpesviral vesicular dermatitis: Secondary | ICD-10-CM | POA: Diagnosis not present

## 2022-07-19 DIAGNOSIS — E7800 Pure hypercholesterolemia, unspecified: Secondary | ICD-10-CM

## 2022-07-19 DIAGNOSIS — I10 Essential (primary) hypertension: Secondary | ICD-10-CM | POA: Diagnosis not present

## 2022-07-19 DIAGNOSIS — K219 Gastro-esophageal reflux disease without esophagitis: Secondary | ICD-10-CM | POA: Diagnosis not present

## 2022-07-19 DIAGNOSIS — R1013 Epigastric pain: Secondary | ICD-10-CM

## 2022-07-19 MED ORDER — METOPROLOL SUCCINATE ER 25 MG PO TB24: 25.0000 mg | ORAL_TABLET | Freq: Every day | ORAL | 3 refills | Status: DC

## 2022-07-19 MED ORDER — ATORVASTATIN CALCIUM 20 MG PO TABS: 20.0000 mg | ORAL_TABLET | Freq: Every day | ORAL | 3 refills | Status: DC

## 2022-07-19 MED ORDER — VALACYCLOVIR HCL 500 MG PO TABS: 500.0000 mg | ORAL_TABLET | Freq: Every day | ORAL | 3 refills | Status: DC

## 2022-07-19 MED ORDER — LANSOPRAZOLE 30 MG PO CPDR: DELAYED_RELEASE_CAPSULE | ORAL | 3 refills | Status: DC

## 2022-07-19 MED ORDER — FAMOTIDINE 20 MG PO TABS: 20.0000 mg | ORAL_TABLET | Freq: Every day | ORAL | 3 refills | Status: DC

## 2022-07-19 NOTE — Progress Notes (Signed)
Established Patient Office Visit  Subjective   Patient ID: Erin Nielsen, female   DOB: 09-Aug-1955 Age: 67 y.o. MRN: [id number]   Chief Complaint  Patient presents with   Follow-up   Medication Refill   HPI Pleasant 67 year old female accompanied by her husband presenting today for chronic disease follow-up.  Reports that she will be going to [place] for the next 6 months and needs extended refills.  Professional interpreter [place] facilitated the appointment in person.  Hypertension: Occasionally checking blood pressure at home and notes that it was in the [id number] systolically few days ago.  Taking Toprol-XL 25 mg daily, tolerating well without side effects.  Following a low-sodium diet. Denies CP, SOB, palpitations, lower extremity edema, dizziness, headaches, or vision changes.  Hyperlipidemia: Taking Lipitor 20 mg daily, tolerating well without side effects.  Following a low-fat heart healthy diet.  GERD: Taking Prevacid 30 mg daily with lunch and famotidine 20 mg nightly.  Feels medications are working well to control her reflux.  Cold sores: Taking Valtrex 500 mg daily, tolerating well without side effects.   Objective:    Vitals:   07/19/22 1500 07/19/22 1519  BP: (!) 174/83 (!) 168/74  Pulse: 64 60  Resp: 20 20  Height: '5\' 3"'$  (1.6 m)   Weight: 135 lb 8 oz (61.5 kg)   SpO2: 98% 96%  BMI (Calculated): 24.01     Physical Exam Vitals reviewed.  Constitutional:      General: She is not in acute distress.    Appearance: Normal appearance. She is normal weight. She is not ill-appearing.  HENT:     Head: Normocephalic and atraumatic.  Cardiovascular:     Rate and Rhythm: Normal rate and regular rhythm.     Pulses: Normal pulses.     Heart sounds: Normal heart sounds.  Pulmonary:     Effort: Pulmonary effort is normal. No respiratory distress.     Breath sounds: Normal breath sounds. No wheezing, rhonchi or rales.  Skin:    General: Skin is warm and dry.   Neurological:     Mental Status: She is alert and oriented to person, place, and time.  Psychiatric:        Mood and Affect: Mood normal.        Behavior: Behavior normal.        Thought Content: Thought content normal.        Judgment: Judgment normal.   No results found for this or any previous visit (from the past 24 hour(s)).     The 10-year ASCVD risk score (Arnett DK, et al., 2019) is: 13.6%   Values used to calculate the score:     Age: 76 years     Sex: Female     Is Non-Hispanic African American: No     Diabetic: No     Tobacco smoker: No     Systolic Blood Pressure: [id number] mmHg     Is BP treated: Yes     HDL Cholesterol: 49 mg/dL     Total Cholesterol: 136 mg/dL   Assessment & Plan:   1. Primary hypertension Blood pressure elevated on arrival and again on recheck.  Home readings have been lower.  Advised her to continue monitoring at home over the next several days and I will reach out to her to see what her home readings look like.  If continuously elevated, we will need to make a dose change to her medication.  Continue low-sodium diet.  Work on getting  regular intentional exercise with a goal of maintaining a healthy weight.  2. Gastroesophageal reflux disease, unspecified whether esophagitis present Continue Prevacid and famotidine as prescribed.  3. Recurrent cold sores Continue Valtrex 500 mg daily.  4. Pure hypercholesterolemia Continue Lipitor 20 mg daily.  Return in about 6 months (around 01/19/2023) for chronic disease follow up.  ___________________________________________ Clearnce Sorrel, DNP, APRN, FNP-BC Primary Care and [place]

## 2022-07-24 ENCOUNTER — Encounter: Payer: Self-pay | Admitting: Medical-Surgical

## 2022-07-25 MED ORDER — METOPROLOL SUCCINATE ER 50 MG PO TB24: 50.0000 mg | ORAL_TABLET | Freq: Every day | ORAL | 0 refills | Status: DC

## 2022-08-07 MED ORDER — AMLODIPINE BESYLATE 2.5 MG PO TABS: 2.5000 mg | ORAL_TABLET | Freq: Every day | ORAL | 0 refills | Status: DC

## 2022-08-07 NOTE — Addendum Note (Signed)
Addended bySamuel Bouche on: 08/07/2022 09:55 AM   Modules accepted: Orders

## 2022-08-14 ENCOUNTER — Ambulatory Visit (INDEPENDENT_AMBULATORY_CARE_PROVIDER_SITE_OTHER): Payer: Medicaid Other | Admitting: Medical-Surgical

## 2022-08-14 ENCOUNTER — Other Ambulatory Visit: Payer: Self-pay

## 2022-08-14 VITALS — BP 118/59 | HR 74 | Ht 63.0 in

## 2022-08-14 DIAGNOSIS — I10 Essential (primary) hypertension: Secondary | ICD-10-CM | POA: Diagnosis not present

## 2022-08-14 MED ORDER — METOPROLOL SUCCINATE ER 50 MG PO TB24: 50.0000 mg | ORAL_TABLET | Freq: Every day | ORAL | 0 refills | Status: DC

## 2022-08-14 MED ORDER — AMLODIPINE BESYLATE 2.5 MG PO TABS: 2.5000 mg | ORAL_TABLET | Freq: Every day | ORAL | 0 refills | Status: DC

## 2022-08-14 NOTE — Progress Notes (Signed)
   Established Patient Office Visit  Subjective   Patient ID: Erin Nielsen, female    DOB: [birth year]  Age: 67 y.o. MRN: [id number]  Chief Complaint  Patient presents with   Hypertension    BP check - nurse visit.     HPI  Hypertension- BP check - nurse visit. Patient denies symptoms or problems with medication. Patient requesting BP medication be refilled as 6 mth supply as she is going out of town.   ROS    Objective:     BP (!) 118/59   Pulse 74   Ht 5\' 3"  (1.6 m)   SpO2 98%   BMI 24.00 kg/m    Physical Exam   No results found for any visits on 08/14/22.    The 10-year ASCVD risk score (Arnett DK, et al., 2019) is: 6.9%    Assessment & Plan:  BP check - nurse visit. - BP reading = 118/59. Per Samuel Bouche, NP - continue current medication regimen  and  o.k. to fill as 6 month supply. Patient will keep schld appt 02/21/2023. Prescription refill sent as qty #180 with note to pharmacy requesting to refill as 6 month supply as [place].  Problem List Items Addressed This Visit       Cardiovascular and Mediastinum   HTN (hypertension) - Primary (Chronic)    Return for keep scheduled appt for 02/21/2023 with Samuel Bouche, NP . Mar[place] Kitchen    Rae Lips, LPN

## 2022-08-14 NOTE — Patient Instructions (Signed)
Continue current mediation regimen and keep scheduled appointment for  02/21/2023.

## 2022-08-14 NOTE — Progress Notes (Signed)
Agree with documentation as below.  ___________________________________________ Tyjuan Demetro L. Adesuwa Osgood, DNP, APRN, FNP-BC Primary Care and Sports Medicine [facility]  

## 2023-02-21 ENCOUNTER — Ambulatory Visit (INDEPENDENT_AMBULATORY_CARE_PROVIDER_SITE_OTHER): Payer: Medicaid Other | Admitting: Medical-Surgical

## 2023-02-21 ENCOUNTER — Ambulatory Visit: Payer: Medicaid Other

## 2023-02-21 ENCOUNTER — Encounter: Payer: Self-pay | Admitting: Medical-Surgical

## 2023-02-21 VITALS — BP 135/73 | HR 61 | Resp 20 | Ht 63.0 in | Wt 129.1 lb

## 2023-02-21 DIAGNOSIS — R1013 Epigastric pain: Secondary | ICD-10-CM

## 2023-02-21 DIAGNOSIS — Z23 Encounter for immunization: Secondary | ICD-10-CM

## 2023-02-21 DIAGNOSIS — B001 Herpesviral vesicular dermatitis: Secondary | ICD-10-CM

## 2023-02-21 DIAGNOSIS — M20001 Unspecified deformity of right finger(s): Secondary | ICD-10-CM | POA: Diagnosis not present

## 2023-02-21 DIAGNOSIS — E7800 Pure hypercholesterolemia, unspecified: Secondary | ICD-10-CM | POA: Diagnosis not present

## 2023-02-21 DIAGNOSIS — M7989 Other specified soft tissue disorders: Secondary | ICD-10-CM | POA: Diagnosis not present

## 2023-02-21 DIAGNOSIS — I10 Essential (primary) hypertension: Secondary | ICD-10-CM | POA: Diagnosis not present

## 2023-02-21 DIAGNOSIS — M79641 Pain in right hand: Secondary | ICD-10-CM | POA: Diagnosis not present

## 2023-02-21 DIAGNOSIS — W57XXXA Bitten or stung by nonvenomous insect and other nonvenomous arthropods, initial encounter: Secondary | ICD-10-CM

## 2023-02-21 DIAGNOSIS — M19041 Primary osteoarthritis, right hand: Secondary | ICD-10-CM | POA: Diagnosis not present

## 2023-02-21 DIAGNOSIS — S50862A Insect bite (nonvenomous) of left forearm, initial encounter: Secondary | ICD-10-CM

## 2023-02-21 DIAGNOSIS — K219 Gastro-esophageal reflux disease without esophagitis: Secondary | ICD-10-CM

## 2023-02-21 DIAGNOSIS — R519 Headache, unspecified: Secondary | ICD-10-CM | POA: Diagnosis not present

## 2023-02-21 MED ORDER — TRIAMCINOLONE ACETONIDE 0.1 % EX CREA
1.0000 | TOPICAL_CREAM | Freq: Two times a day (BID) | CUTANEOUS | 0 refills | Status: AC
Start: 2023-02-21 — End: 2023-03-07

## 2023-02-21 MED ORDER — ATORVASTATIN CALCIUM 20 MG PO TABS: 20.0000 mg | ORAL_TABLET | Freq: Every day | ORAL | 3 refills | Status: DC

## 2023-02-21 MED ORDER — AMLODIPINE BESYLATE 2.5 MG PO TABS: 2.5000 mg | ORAL_TABLET | Freq: Every day | ORAL | 3 refills | Status: DC

## 2023-02-21 MED ORDER — LANSOPRAZOLE 30 MG PO CPDR: DELAYED_RELEASE_CAPSULE | ORAL | 3 refills | Status: AC

## 2023-02-21 MED ORDER — VALACYCLOVIR HCL 500 MG PO TABS: 500.0000 mg | ORAL_TABLET | Freq: Every day | ORAL | 3 refills | Status: DC

## 2023-02-21 MED ORDER — FAMOTIDINE 20 MG PO TABS: 20.0000 mg | ORAL_TABLET | Freq: Every day | ORAL | 3 refills | Status: AC

## 2023-02-21 MED ORDER — METOPROLOL SUCCINATE ER 50 MG PO TB24: 50.0000 mg | ORAL_TABLET | Freq: Every day | ORAL | 3 refills | Status: DC

## 2023-02-21 NOTE — Progress Notes (Signed)
Established patient visit  History, exam, impression, and plan:  1. Primary hypertension Pleasant 67 year old female presenting today with a history of hypertension.  She has been taking amlodipine 2.5 mg and Toprol-XL 50 mg daily, tolerating well without side effects.  Checking blood pressures at home and reports that her systolics are usually in the 110s-120s.  Reports intermittent, mild midsternal chest pain that is not activity related and only lasts a few seconds.  No associated symptoms during this time.  Notes that the symptoms are similar to when she was seen before and provided with antacids so suspect this is simply related to reflux or musculoskeletal issues.  New onset headache as noted below.  No other concerning symptoms.  Cardiopulmonary exam normal.  Checking labs today.  Blood pressure elevated on arrival however at goal on recheck.  Continue amlodipine and Toprol-XL as prescribed. - CBC with Differential/Platelet - CMP14+EGFR - Lipid panel  2. Pure hypercholesterolemia Currently taking Lipitor 20 mg daily, tolerating well without side effects.  Following a low-fat heart healthy diet and staying physically active.  Checking labs as below.  Continue Lipitor. - atorvastatin (LIPITOR) 20 MG tablet; Take 1 tablet (20 mg total) by mouth daily.  Dispense: 90 tablet; Refill: 3 - CMP14+EGFR - Lipid panel  3. Epigastric pain Has been using Prilosec and Pepcid on an as-needed basis which seems to work well to control her symptoms.  Suspect some of her chest pain as noted above is related to reflux so would advise to increase dosing on this over the next couple of weeks to see if that fully resolves the issue.  Plan to continue Prilosec and Pepcid as prescribed. - famotidine (PEPCID) 20 MG tablet; Take 1 tablet (20 mg total) by mouth at bedtime.  Dispense: 90 tablet; Refill: 3  4. Gastroesophageal reflux disease, unspecified whether esophagitis present See above.  5. Recurrent  cold sores Taking Valtrex 500 mg daily, tolerating well without side effects.  This is working well for her and she has not had any recent outbreaks.  Continue Valtrex as prescribed.  6. Need for influenza vaccination Flu vaccine given in office today. - Flu Vaccine Trivalent High Dose (Fluad)  7. Need for pneumococcal 20-valent conjugate vaccination Prevnar 20 given in office today. - Pneumococcal conjugate vaccine 20-valent (Prevnar 20)  8. Right hand pain Has had some issues with right hand pain on an intermittent basis and now has some swelling of the thumb.  Getting x-rays for further evaluation.  Suspect osteoarthritis as the culprit. - DG Hand Complete Right; Future  9. Deformity of right thumb joint About 2 months ago, had a tender spot develop on the medial side of the right thumb joint.  This area became swollen and remained tender for a while.  She did use massage regularly and before along the tenderness resolved.  The size has gone down just a bit however the swollen area is still present and limiting her ability to flex the thumb joint.  No pain, erythema, fluctuance noted.  Getting x-rays today for further evaluation. - DG Hand Complete Right; Future  10. New onset of headaches after age 26 Reports that she has had new onset of a right sided headache that has been present for a week.  It is there during the day but worse at night if she sleeps on her right side.  Denies any neurological changes or vision disturbances.  Has not been able to find any over-the-counter or home  remedies to help with her headache so far.  Her exam is benign today but it is concerning to have a new onset headache that has consistently presented for a week at her age.  Plan for MRI brain without contrast for further evaluation.  In the meantime, okay to use Tylenol 1000 mg every 8 hours as needed. - MR Brain Wo Contrast; Future  11.  Insect bite of the left forearm Reports that several days ago she was  bitten by an insect while out in the field.  She is not sure what insect bit her however the area has become very red, warm, swollen, pruritic, and firm to touch.  No drainage from the area and she has been trying not to scratch it.  Tried an over-the-counter bug bite ointment but this was not helpful.  She also has several small bites around her neck that are very itchy.  On evaluation, it looks like this is more of a local allergic reaction.  Adding triamcinolone cream twice daily for up to 14 days to the affected area.  Procedures performed this visit: None.  No follow-ups on file.  __________________________________ Thayer Ohm, DNP, APRN, FNP-BC Primary Care and Sports Medicine [facility] [place]

## 2023-02-22 LAB — CMP14+EGFR
ALT: 28 [IU]/L (ref 0–32)
AST: 40 [IU]/L (ref 0–40)
Albumin: 4.5 g/dL (ref 3.9–4.9)
Alkaline Phosphatase: 66 [IU]/L (ref 44–121)
BUN/Creatinine Ratio: 15 (ref 12–28)
BUN: 10 mg/dL (ref 8–27)
Bilirubin Total: 0.9 mg/dL (ref 0.0–1.2)
CO2: 22 mmol/L (ref 20–29)
Calcium: 9.3 mg/dL (ref 8.7–10.3)
Chloride: 99 mmol/L (ref 96–106)
Creatinine, Ser: 0.67 mg/dL (ref 0.57–1.00)
Globulin, Total: 3.2 g/dL (ref 1.5–4.5)
Glucose: 80 mg/dL (ref 70–99)
Potassium: 4.2 mmol/L (ref 3.5–5.2)
Sodium: 139 mmol/L (ref 134–144)
Total Protein: 7.7 g/dL (ref 6.0–8.5)
eGFR: 96 mL/min/{1.73_m2} (ref >59)

## 2023-02-22 LAB — CBC WITH DIFFERENTIAL/PLATELET
Basophils Absolute: 0.1 10*3/uL (ref 0.0–0.2)
Basos: 2 %
EOS (ABSOLUTE): 0.6 10*3/uL — ABNORMAL HIGH (ref 0.0–0.4)
Eos: 10 %
Hematocrit: 40 % (ref 34.0–46.6)
Hemoglobin: 13.1 g/dL (ref 11.1–15.9)
Immature Grans (Abs): 0 10*3/uL (ref 0.0–0.1)
Immature Granulocytes: 0 %
Lymphocytes Absolute: 2.7 10*3/uL (ref 0.7–3.1)
Lymphs: 46 %
MCH: 29.6 pg (ref 26.6–33.0)
MCHC: 32.8 g/dL (ref 31.5–35.7)
MCV: 91 fL (ref 79–97)
Monocytes Absolute: 0.4 10*3/uL (ref 0.1–0.9)
Monocytes: 6 %
Neutrophils Absolute: 2.2 10*3/uL (ref 1.4–7.0)
Neutrophils: 36 %
Platelets: 293 10*3/uL (ref 150–450)
RBC: 4.42 x10E6/uL (ref 3.77–5.28)
RDW: 12.1 % (ref 11.7–15.4)
WBC: 6 10*3/uL (ref 3.4–10.8)

## 2023-02-22 LAB — LIPID PANEL
Chol/HDL Ratio: 2.8 {ratio} (ref 0.0–4.4)
Cholesterol, Total: 134 mg/dL (ref 100–199)
HDL: 48 mg/dL (ref >39)
LDL Chol Calc (NIH): 70 mg/dL (ref 0–99)
Triglycerides: 79 mg/dL (ref 0–149)
VLDL Cholesterol Cal: 16 mg/dL (ref 5–40)

## 2023-03-16 ENCOUNTER — Encounter: Payer: Self-pay | Admitting: Medical-Surgical

## 2023-04-03 ENCOUNTER — Encounter: Payer: Self-pay | Admitting: Medical-Surgical

## 2023-04-03 DIAGNOSIS — I6523 Occlusion and stenosis of bilateral carotid arteries: Secondary | ICD-10-CM | POA: Diagnosis not present

## 2023-04-03 DIAGNOSIS — I639 Cerebral infarction, unspecified: Secondary | ICD-10-CM | POA: Diagnosis not present

## 2023-04-03 DIAGNOSIS — R9082 White matter disease, unspecified: Secondary | ICD-10-CM | POA: Diagnosis not present

## 2023-04-03 DIAGNOSIS — Z8673 Personal history of transient ischemic attack (TIA), and cerebral infarction without residual deficits: Secondary | ICD-10-CM | POA: Diagnosis not present

## 2023-04-03 DIAGNOSIS — R471 Dysarthria and anarthria: Secondary | ICD-10-CM | POA: Diagnosis not present

## 2023-04-03 DIAGNOSIS — I10 Essential (primary) hypertension: Secondary | ICD-10-CM | POA: Diagnosis not present

## 2023-04-04 DIAGNOSIS — R471 Dysarthria and anarthria: Secondary | ICD-10-CM | POA: Diagnosis not present

## 2023-04-04 DIAGNOSIS — I351 Nonrheumatic aortic (valve) insufficiency: Secondary | ICD-10-CM | POA: Diagnosis not present

## 2023-04-04 DIAGNOSIS — Z95818 Presence of other cardiac implants and grafts: Secondary | ICD-10-CM | POA: Diagnosis not present

## 2023-04-04 DIAGNOSIS — Z9889 Other specified postprocedural states: Secondary | ICD-10-CM | POA: Diagnosis not present

## 2023-04-07 ENCOUNTER — Telehealth: Payer: Self-pay

## 2023-04-07 NOTE — Transitions of Care (Post Inpatient/ED Visit) (Unsigned)
   04/07/2023  Name: Erin Nielsen MRN: [id number] DOB: [birth year]  Today's TOC FU Call Status: Today's TOC FU Call Status:: Unsuccessful Call (1st Attempt) Unsuccessful Call (1st Attempt) Date: 04/07/23  Attempted to reach the patient regarding the most recent Inpatient/ED visit.  Follow Up Plan: Additional outreach attempts will be made to reach the patient to complete the Transitions of Care (Post Inpatient/ED visit) call.   Signature Karena Addison, LPN [facility] Nurse Health Advisor Direct Dial [phone]

## 2023-04-08 NOTE — Transitions of Care (Post Inpatient/ED Visit) (Signed)
   04/08/2023  Name: Erin Nielsen MRN: [id number] DOB: 1956-01-01  Today's TOC FU Call Status: Today's TOC FU Call Status:: Successful TOC FU Call Completed Unsuccessful Call (1st Attempt) Date: 04/07/23 [facility] FU Call Complete Date: 04/08/23 Patient's Name and Date of Birth confirmed.  Transition Care Management Follow-up Telephone Call Date of Discharge: 04/04/23 Discharge Facility: Other [facility]) Name of Other ([facility]) Discharge Facility: [facility] [place] Type of Discharge: Inpatient Admission Primary Inpatient Discharge Diagnosis:: CVA How have you been since you were released from the hospital?: Better Any questions or concerns?: No  Items Reviewed: Did you receive and understand the discharge instructions provided?: Yes Medications obtained,verified, and reconciled?: Yes (Medications Reviewed) Any new allergies since your discharge?: No Dietary orders reviewed?: Yes Do you have support at home?: Yes People in Home: child(ren), adult  Medications Reviewed Today: Medications Reviewed Today     Reviewed by Karena Addison, LPN (Licensed Practical Nurse) on 04/08/23 at 1613  Med List Status: <None>   Medication Order Taking? Sig Documenting Provider Last Dose Status Informant  amLODipine (NORVASC) 2.5 MG tablet [id number]  Take 1 tablet (2.5 mg total) by mouth daily. Christen Butter, NP  Active   atorvastatin (LIPITOR) 20 MG tablet [id number]  Take 1 tablet (20 mg total) by mouth daily. Christen Butter, NP  Active   famotidine (PEPCID) 20 MG tablet [id number]  Take 1 tablet (20 mg total) by mouth at bedtime. Christen Butter, NP  Active   lansoprazole (PREVACID) 30 MG capsule [id number]  TAKE ONE CAPSULE BY MOUTH DAILY AT 12 Davene Costain, Joy, NP  Active   metoprolol succinate (TOPROL XL) 50 MG 24 hr tablet [id number]  Take 1 tablet (50 mg total) by mouth daily. Take with or immediately following a meal. Christen Butter, NP  Active   topiramate (TOPAMAX) 25 MG tablet [id number] Yes Take  25 mg by mouth daily. [provider]  Active   valACYclovir (VALTREX) 500 MG tablet [id number]  Take 1 tablet (500 mg total) by mouth daily. Christen Butter, NP  Active             Home Care and Equipment/Supplies: Were Home Health Services Ordered?: NA Any new equipment or medical supplies ordered?: NA  Functional Questionnaire: Do you need assistance with bathing/showering or dressing?: No Do you need assistance with meal preparation?: No Do you need assistance with eating?: No Do you have difficulty maintaining continence: No Do you need assistance with getting out of bed/getting out of a chair/moving?: No Do you have difficulty managing or taking your medications?: No  Follow up appointments reviewed: PCP Follow-up appointment confirmed?: Yes Date of PCP follow-up appointment?: 04/16/23 Follow-up Provider: [facility] Follow-up appointment confirmed?: NA Do you need transportation to your follow-up appointment?: No Do you understand care options if your condition(s) worsen?: Yes-patient verbalized understanding    SIGNATURE Karena Addison, LPN [facility] Nurse Health Advisor Direct Dial [phone]

## 2023-04-15 ENCOUNTER — Encounter: Payer: Self-pay | Admitting: Medical-Surgical

## 2023-04-16 ENCOUNTER — Encounter: Payer: Self-pay | Admitting: Medical-Surgical

## 2023-04-16 ENCOUNTER — Ambulatory Visit (INDEPENDENT_AMBULATORY_CARE_PROVIDER_SITE_OTHER): Payer: Medicaid Other | Admitting: Medical-Surgical

## 2023-04-16 VITALS — BP 153/70 | HR 58 | Resp 20 | Ht 63.0 in | Wt 131.1 lb

## 2023-04-16 DIAGNOSIS — I639 Cerebral infarction, unspecified: Secondary | ICD-10-CM

## 2023-04-16 DIAGNOSIS — Z09 Encounter for follow-up examination after completed treatment for conditions other than malignant neoplasm: Secondary | ICD-10-CM

## 2023-04-16 DIAGNOSIS — E7800 Pure hypercholesterolemia, unspecified: Secondary | ICD-10-CM

## 2023-04-16 DIAGNOSIS — I10 Essential (primary) hypertension: Secondary | ICD-10-CM | POA: Diagnosis not present

## 2023-04-16 DIAGNOSIS — G43009 Migraine without aura, not intractable, without status migrainosus: Secondary | ICD-10-CM

## 2023-04-16 MED ORDER — ATORVASTATIN CALCIUM 80 MG PO TABS: 80.0000 mg | ORAL_TABLET | Freq: Every day | ORAL | 1 refills | Status: DC

## 2023-04-16 MED ORDER — TOPIRAMATE 25 MG PO TABS: 25.0000 mg | ORAL_TABLET | Freq: Every day | ORAL | 1 refills | Status: DC

## 2023-04-16 MED ORDER — ASPIRIN 81 MG PO CHEW: 81.0000 mg | CHEWABLE_TABLET | Freq: Every day | ORAL | 1 refills | Status: AC

## 2023-04-16 NOTE — Progress Notes (Signed)
        Established patient visit  History, exam, impression, and plan:  1. Hospital discharge follow-up 2. Cerebrovascular accident (CVA), unspecified mechanism (HCC) 3. Migraine without aura and without status migrainosus, not intractable Very pleasant 67 year old female presenting today after being seen in the emergency room on 04/03/2023.  The night before she had experienced slurred speech, right sided paresthesias along the face and arm as well as brain fog and difficulty thinking.  She was kept overnight for observation and discontinued home the next day.  During her stay there, she was diagnosed with a CVA and started on aspirin 81 mg daily, Lipitor 80 mg daily, and Topamax 25 mg daily.  She has been compliant with medication dosing and reports that she is feeling well today.  Has no residual symptoms from her CVA.  Notes that she felt bad with a headache for a few days after discharge however today she is feeling much better.  She received a referral for neurology at discharge however through neurology location is too far for her to drive independently and her daughter is at work and unable to transport her.  Requesting a new referral be placed today that is more local.  On exam, she has no neurological abnormalities.  She is alert, oriented x 3, normal speech, normal affect, normal mood.  Gait smooth and steady.  Muscle strength 5/5 in all extremities.  No facial drooping.  Plan to continue Topamax, elevated dose of Lipitor, and aspirin as prescribed.  Refills sent today.  Placing a new referral to neurology in [place] as she reports that she will be able to get there. - Ambulatory referral to Neurology  4. Primary hypertension She does have a history of hypertension that at home is very well-controlled.  Unfortunately, I suspect there is a bit of whitecoat hypertension as her blood pressures are elevated on arrival and again on recheck.  Plan to continue her current medications as  prescribed since her home readings are doing well.  No concerning symptoms today and cardiopulmonary exam is normal.  5. Pure hypercholesterolemia Recently started on atorvastatin 20 mg daily however this was increased as noted above to 80 mg daily due to recent CVA findings.  Refilling Lipitor 80 mg daily today. - atorvastatin (LIPITOR) 80 MG tablet; Take 1 tablet (80 mg total) by mouth daily.  Dispense: 90 tablet; Refill: 1   Procedures performed this visit: None.  Return in about 3 months (around 07/15/2023) for chronic disease follow up.  __________________________________ Thayer Ohm, DNP, APRN, FNP-BC Primary Care and Sports Medicine [facility]

## 2023-04-24 ENCOUNTER — Encounter: Payer: Self-pay | Admitting: Neurology

## 2023-05-02 NOTE — Progress Notes (Unsigned)
NEUROLOGY CONSULTATION NOTE  Erin Nielsen MRN: [id number] DOB: [birth year]  Referring provider: Christen Butter, NP Primary care provider: Christen Butter, NP  Reason for consult:  Stroke  Assessment/Plan:   Left thalamocapsular infarct, likely secondary to small vessel disease Migraine without aura, without status migrainosus, not intractable Left hand paresthesias may be side effect of topiramate (already has paresthesias in the right hand. Hypertension Hyperlipidemia   Migraine prevention:  To see if paresthesias resolve, will discontinue topiramate and change to nortriptyline 10mg  at bedtime.  We can increase to 25mg  at bedtime if needed. Limit use of pain relievers to no more than 2 days out of week to prevent risk of rebound or medication-overuse headache. Secondary stroke prevention: ASA 81mg  daily Decrease atorvastatin back to 20mg  daily (LDL was already at goal of less than 70) Normotensive blood pressure Hgb A1c goal less than 7 Mediterranean diet Follow up 5 months.  Total time spent in chart and face to face with patient:  60 minutes   Subjective:  Erin Nielsen is a 67 year old left-handed female with HTN, HLD and GERD who presents for stroke.  History supplemented by hospital records and referring provider's note.  Go back to 20mg  atorvastatin  She has history of migraines since her 63s.  They are severe right sided pounding headaches with photophobia and phonophobia.  In early October, the headaches began getting worse.  Worse when sleeping on her right side.  She started feeling lightheaded and foggy-headed.  Blood pressure in the mornings were around 136-144 systolic.  She then endorsed right facial numbness and on a couple of nights, she woke up and felt that the right side of her face "had dropped" and had slurred speech.  She was advised by her PCP to go to the hospital and she went to [facility] on 04/03/2023.  MRI of brain without  contrast revealed mild chronic small vessel ischemic changes in the periventricular white matter and a remote left basal ganglia infarct but no acute or subacute findings.  CTA head and neck revealed no LVO or hemodynamically significant stenosis but a slightly undulating contour of the cervical ICAs seen, either artifactual or may represent fibromuscular dysplasia.  2D echo revealed EF 60-65% with negative bubble study.  LDL was 60 and Hgb A1c was 5.9.  She was started on ASA and atorvastatin was increased from 20mg  to 80mg  daily.  She was started on topiramate for headache.  No headaches since starting topiramate.  Still has significant numbness and tingling in the right arm.  Over the past couple of weeks, started getting numbness and tingling in the left hand as well.   Current medications:  ASA 81mg  daily, atorvastatin 80mg  daily, amlodipine, metoprolol succinate, topiramate 25mg  daily     PAST MEDICAL HISTORY: Past Medical History:  Diagnosis Date   Arthritis    Dizziness    Hyperlipidemia     PAST SURGICAL HISTORY: Past Surgical History:  Procedure Laterality Date   BREAST EXCISIONAL BIOPSY Right 10/2017   COLONOSCOPY N/A 03/30/2015   Procedure: COLONOSCOPY;  Surgeon: Corbin Ade, MD;  Location: [facility];  Service: Endoscopy;  Laterality: N/A;  1430 - pt can't come earlier/lives far away   ESOPHAGOGASTRODUODENOSCOPY N/A 03/30/2015   Procedure: ESOPHAGOGASTRODUODENOSCOPY (EGD);  Surgeon: Corbin Ade, MD;  Location: [facility];  Service: Endoscopy;  Laterality: N/A;    MEDICATIONS: Current Outpatient Medications on File Prior to Visit  Medication Sig Dispense Refill   amLODipine (  NORVASC) 2.5 MG tablet Take 1 tablet (2.5 mg total) by mouth daily. 90 tablet 3   aspirin 81 MG chewable tablet Chew 1 tablet (81 mg total) by mouth daily. 90 tablet 1   atorvastatin (LIPITOR) 80 MG tablet Take 1 tablet (80 mg total) by mouth daily. 90 tablet 1   famotidine (PEPCID) 20 MG  tablet Take 1 tablet (20 mg total) by mouth at bedtime. 90 tablet 3   lansoprazole (PREVACID) 30 MG capsule TAKE ONE CAPSULE BY MOUTH DAILY AT 12 NOON 90 capsule 3   metoprolol succinate (TOPROL XL) 50 MG 24 hr tablet Take 1 tablet (50 mg total) by mouth daily. Take with or immediately following a meal. 90 tablet 3   topiramate (TOPAMAX) 25 MG tablet Take 1 tablet (25 mg total) by mouth daily. 90 tablet 1   valACYclovir (VALTREX) 500 MG tablet Take 1 tablet (500 mg total) by mouth daily. 90 tablet 3   No current facility-administered medications on file prior to visit.    ALLERGIES: Allergies  Allergen Reactions   Ibuprofen     Other reaction(s): Dizziness (intolerance)    FAMILY HISTORY: Family History  Problem Relation Age of Onset   Tongue cancer Brother    Colon cancer Neg Hx     Objective:  Blood pressure 124/75, pulse 60, height 4' 11.06" (1.5 m), weight 132 lb (59.9 kg), SpO2 98%. General: No acute distress.  Patient appears well-groomed.   Head:  Normocephalic/atraumatic Eyes:  fundi examined but not visualized Neck: supple, no paraspinal tenderness, full range of motion Heart: regular rate and rhythm Neurological Exam: Mental status: alert and oriented to person, place, and time, speech fluent and not dysarthric, language intact. Cranial nerves: CN I: not tested CN II: pupils equal, round and reactive to light, visual fields intact CN III, IV, VI:  full range of motion, no nystagmus, no ptosis CN V: reduced right V1-V3 CN VII: upper and lower face symmetric CN VIII: hearing intact CN IX, X: gag intact, uvula midline CN XI: sternocleidomastoid and trapezius muscles intact CN XII: tongue midline Bulk & Tone: normal, no fasciculations. Motor:  muscle strength 5/5 throughout Sensation:  Pinprick sensation reduced in right upper and lower extremities. Vibratory sensation intact. Deep Tendon Reflexes:  2+ throughout,  toes downgoing.   Finger to nose testing:  Without  dysmetria.   Gait:  Normal station and stride.  Romberg negative.    Thank you for allowing me to take part in the care of this patient.  Shon Millet, DO  CC: Christen Butter, NP

## 2023-05-05 ENCOUNTER — Encounter: Payer: Self-pay | Admitting: Neurology

## 2023-05-05 ENCOUNTER — Ambulatory Visit: Payer: Medicaid Other | Admitting: Neurology

## 2023-05-05 VITALS — BP 124/75 | HR 60 | Ht 59.06 in | Wt 132.0 lb

## 2023-05-05 DIAGNOSIS — I639 Cerebral infarction, unspecified: Secondary | ICD-10-CM

## 2023-05-05 DIAGNOSIS — E785 Hyperlipidemia, unspecified: Secondary | ICD-10-CM

## 2023-05-05 DIAGNOSIS — G43009 Migraine without aura, not intractable, without status migrainosus: Secondary | ICD-10-CM

## 2023-05-05 DIAGNOSIS — I10 Essential (primary) hypertension: Secondary | ICD-10-CM

## 2023-05-05 MED ORDER — NORTRIPTYLINE HCL 10 MG PO CAPS: 10.0000 mg | ORAL_CAPSULE | Freq: Every day | ORAL | 5 refills | Status: DC

## 2023-05-05 MED ORDER — ATORVASTATIN CALCIUM 20 MG PO TABS: 20.0000 mg | ORAL_TABLET | Freq: Every day | ORAL | 5 refills | Status: DC

## 2023-05-05 NOTE — Patient Instructions (Addendum)
Continue aspirin 81mg  daily Decrease atorvastatin back to 20mg  daily Stop topiramate.  Instead, start nortriptyline 10mg  at bedtime.  If no improvement in headaches in 4 weeks, contact me and we can increase dose.  May take acetaminophen for headaches but limit to no more than 10 days out of the month.  1. ?????????? 81 ?? 2. ????????????? 20 ?? 3. ??????  ????????? 10 ???????  ?? 4 ???????????????????????  4. ?????????????????????? 10 ?? 1. Jx m?i r fyng ?s?p?ln 81 hok 2. Ji?ng ? tu? f t?t?ng jling ji?nsh?o zh m?i r 20 hok 3. Tng yng tu?b?zh?[place]  Xi?ngf?n, shu qin k?ish? fyng 10 hok q ji? t ln.  Rgu? 4 zh?u ni tutng miy?u g?ishn, q?ng linx w?, w?men k?y? z?ngji? jling.  4. K?y? fyng du y?xi?n ?nj? f?n zhlio tutng, dn m?i yu bd ch?ogu 10 ti?n.     ????? Mediterranean Diet ????????????????????????????????????????????????????????????????????????????????????????????????????????????????????????????????? ????????????? ?????? ????????????????????????????????????? ????????????????????????????? ?????????????????? ??  ????????????????? ????????????? ?????????????????????????????????? ????? ?????? ?????? ?????????????? ????????????????? ???????????? ????????????????????????????????????????????? ??????????????????????????????????????????????? ?? ??????????????????????????? ????????????????????????????????????????????????? ??????????????????????????? ????????????????????????????? ??????????????????? ????????????????????????????????????????????? ????????????????????????? ???? ????????????????????????????? ??? 2 ?? 2 ?????? ??????????? ????????? ????? ?????????? ??????????? ????? 2-3 ?? ????? 4-5 ?? ???????? 2 ?? ?????????? 1 ?? ????? 2 ????? ???????? 1-2 ?? ?????? 6-8 ?? ?????????? 3-4 ?? ??????????????? ????  ?????????????????? ???????????????????? ????????????? ???????????? ????????????????? ?????? 7-8  ?????? ?????????????????????????????????? 1 ?????? 2 ????????? 5 ?? (150 mL)? ????????? ?? ?????????????????????????????????????????????? ?? ????????????????????????????????????????????????????????? ?? ???????????????????????????????????? ???????? ?????????????????????????????????????????????????? ??? ????????????? ????? ????????????????? ?? ???????? ????? ??????????????????????? ??????? ????????????????????????????????????????????????????????????????????? ???????????????????????????????????????????? ???????????? ??????????????????????? ?? ??????? ?? ????????? ?? ????????????????????????????? ???????? ???????????????????? ??? ????????????? ????? ?????????????????????? ?? ??????????????? ????? ??????????? ??????? ????????????? ??????????????????????????????????????????? ?? ????????????????? ????????????????????????? ???????????? ?????????????????????????????????? 1 ?????? 2 ????????? 5 ?? (150 mL)? ???????????????????????????????????????????????? Document Revised: 06/04/2019 Document Reviewed: 05/27/2019 Elsevier Patient Education  2023 [employer].

## 2023-07-16 ENCOUNTER — Ambulatory Visit: Payer: MEDICAID | Admitting: Medical-Surgical

## 2023-07-16 ENCOUNTER — Encounter: Payer: Self-pay | Admitting: Medical-Surgical

## 2023-07-16 VITALS — BP 136/70 | HR 65 | Resp 20 | Ht 59.06 in | Wt 136.8 lb

## 2023-07-16 DIAGNOSIS — G43009 Migraine without aura, not intractable, without status migrainosus: Secondary | ICD-10-CM | POA: Diagnosis not present

## 2023-07-16 DIAGNOSIS — I10 Essential (primary) hypertension: Secondary | ICD-10-CM | POA: Diagnosis not present

## 2023-07-16 MED ORDER — AMLODIPINE BESYLATE 5 MG PO TABS: 5.0000 mg | ORAL_TABLET | Freq: Every day | ORAL | 3 refills | Status: DC

## 2023-07-16 NOTE — Progress Notes (Signed)
        Established patient visit  History, exam, impression, and plan:  1. Migraine without aura and without status migrainosus, not intractable (Primary) Very pleasant 68 year old female presenting today for follow-up on migraines.  She was able to get in with neurology and notes that they changed her medication from Topamax to nortriptyline as she was having paresthesias with the Topamax.  Since stopping the Topamax, her paresthesias have completely resolved.  She is taking 10 mg nightly of nortriptyline and reports this is helping control her migraines very well.  Plans to follow-up with neurology as scheduled.  2. Primary hypertension Currently taking 2.5 mg of amlodipine along with 50 mg of Toprol XL daily.  Monitoring blood pressures at home and notes readings have been mildly elevated above goal on a regular basis.  Blood pressure is okay today however it is 136 systolically which is a little higher than recommended.  Her neurologist recommended monitoring her blood pressure and working to keep it with a systolic blood pressure of no more than 120.  Continue Toprol XL 50 mg daily.  Increasing amlodipine to 5 mg daily.  Continue monitoring blood pressure at home with a goal of 120/80 or less.  Continue low-sodium diet.   Procedures performed this visit: None.  Return in about 2 weeks (around 07/30/2023) for nurse visit for BP check; follow up with me in 3 months for chronic disease management.  __________________________________ Thayer Ohm, DNP, APRN, FNP-BC Primary Care and Sports Medicine [facility]

## 2023-07-18 ENCOUNTER — Encounter: Payer: Self-pay | Admitting: Medical-Surgical

## 2023-07-30 ENCOUNTER — Ambulatory Visit (INDEPENDENT_AMBULATORY_CARE_PROVIDER_SITE_OTHER): Payer: MEDICAID

## 2023-07-30 VITALS — BP 126/71 | HR 61 | Ht 59.06 in

## 2023-07-30 DIAGNOSIS — I10 Essential (primary) hypertension: Secondary | ICD-10-CM | POA: Diagnosis not present

## 2023-07-30 NOTE — Progress Notes (Signed)
   Established Patient Office Visit  Subjective   Patient ID: Erin Nielsen, female    DOB: 1955/06/18  Age: 68 y.o. MRN: [id number]  Chief Complaint  Patient presents with   Hypertension    BP check nurse visit.     HPI  Hypertension- BP check nurse visit. Patient denies chest pain, shorntess of breath, palpitations, headaches, dizziness, or medication problems . Using Mandarin interpreter # [phone] "AMY". Home BP readings - 117/78, 121/75, 120/75, 114/75, 132/79, 114/75, 121/80, 126/78, 110/69, 117/76, 105/67, 118/75, 106/70, 114/74, 124/76, 121/76, 126/75, 120/75, 117/77, 120/75, 120/72, 119/76, 106/66, 120/77.  ROS    Objective:     BP 126/71   Pulse 61   Ht 4' 11.06" (1.5 m)   SpO2 100%   BMI 27.57 kg/m    Physical Exam   No results found for any visits on 07/30/23.    The ASCVD Risk score (Arnett DK, et al., 2019) failed to calculate for the following reasons:   Risk score cannot be calculated because patient has a medical history suggesting prior/existing ASCVD    Assessment & Plan:  Bp initial reading = 142/67. ( Home reading this morning was 120/77)- second reading = 126/71. Per Christen Butter, NP - she is happy with current home BP readings- patient should continue current medication regimen and keep upcoming appt scheduled with her for October 16, 2023.  Problem List Items Addressed This Visit       Cardiovascular and Mediastinum   HTN (hypertension) - Primary (Chronic)    Return for return for appt schld for June 5th, 2025 with Christen Butter, NP .    Elizabeth Palau, LPN

## 2023-07-30 NOTE — Patient Instructions (Signed)
 continue current medication regimen and keep upcoming appt scheduled with her for October 16, 2023.

## 2023-08-22 ENCOUNTER — Ambulatory Visit: Payer: Medicaid Other | Admitting: Medical-Surgical

## 2023-10-16 ENCOUNTER — Ambulatory Visit: Payer: MEDICAID | Admitting: Medical-Surgical

## 2023-10-16 ENCOUNTER — Encounter: Payer: Self-pay | Admitting: Medical-Surgical

## 2023-10-16 VITALS — BP 132/69 | HR 61 | Resp 20 | Ht 59.06 in | Wt 134.0 lb

## 2023-10-16 DIAGNOSIS — G43009 Migraine without aura, not intractable, without status migrainosus: Secondary | ICD-10-CM | POA: Diagnosis not present

## 2023-10-16 DIAGNOSIS — R7303 Prediabetes: Secondary | ICD-10-CM

## 2023-10-16 DIAGNOSIS — I10 Essential (primary) hypertension: Secondary | ICD-10-CM | POA: Diagnosis not present

## 2023-10-16 DIAGNOSIS — Z1231 Encounter for screening mammogram for malignant neoplasm of breast: Secondary | ICD-10-CM | POA: Diagnosis not present

## 2023-10-16 LAB — POCT GLYCOSYLATED HEMOGLOBIN (HGB A1C)
HbA1c, POC (prediabetic range): 5.7 % (ref 5.7–6.4)
Hemoglobin A1C: 5.7 % — AB (ref 4.0–5.6)

## 2023-10-16 MED ORDER — AMLODIPINE BESYLATE 5 MG PO TABS
5.0000 mg | ORAL_TABLET | Freq: Every day | ORAL | 3 refills | Status: AC
  Filled 2024-04-03: qty 90, 90d supply, fill #0

## 2023-10-16 MED ORDER — SUMATRIPTAN SUCCINATE 50 MG PO TABS: 25.0000 mg | ORAL_TABLET | ORAL | 0 refills | Status: DC | PRN

## 2023-10-16 NOTE — Progress Notes (Signed)
        Established patient visit  History, exam, impression, and plan:  Danna Duster, professional interpreter [phone] assisted with translation for this appointment.   1. Primary hypertension (Primary) Pleasant 68 year old female presenting today with history of hypertension currently treated with amlodipine  5 mg daily and Toprol  XL 50 mg daily.  Tolerating both medications well without side effects.  Feels that these medicines are working well to help control her blood pressure.  She is checking her blood pressures at home and notes her readings are at goal of 130/80 or less.  Following a low-sodium heart healthy diet.  Staying physically active as much as tolerated.  She is up-to-date on labs.  Cardiopulmonary exam is normal.  Denies concerning symptoms today.  Blood pressure in office 155/69 showing excellent control.  Continue amlodipine  and Toprol -XL as prescribed.  2. Encounter for screening mammogram for malignant neoplasm of breast She is due for mammogram so ordering today. - MM DIGITAL SCREENING BILATERAL; Future  3. Migraine without aura and without status migrainosus, not intractable History of migraines that she is using nortriptyline  10 mg nightly and Toprol  XL 50 mg daily 4.  Notes that she still has migraines during the day once every 1 to 2 months but has not had any nighttime migraines.  When she does have a migraine, she describes it as a throbbing at the top back of her head on the right side.  Has no other signs or symptoms.  Is using a [employer] medication but is not sure what the name is.  Notes that massage also is helpful.  Discussed options to use for abortive treatment.  Plan to start Imitrex  as needed for breakthrough migraines.  4. Prediabetes Last hemoglobin A1c checked in ED 03/2023 was 5.9%.  Today, recheck at 5.7%.  She is still in the prediabetic range and should work on lower intake on concentrated sweets and simple carbohydrates.  - POCT HgB A1C  Procedures  performed this visit: None.  Return in about 6 months (around 04/16/2024) for chronic disease follow up.  __________________________________ Maryl Snook, DNP, APRN, FNP-BC Primary Care and Sports Medicine [facility]

## 2023-10-20 ENCOUNTER — Ambulatory Visit: Payer: Medicaid Other | Admitting: Neurology

## 2023-10-21 ENCOUNTER — Ambulatory Visit (INDEPENDENT_AMBULATORY_CARE_PROVIDER_SITE_OTHER): Payer: MEDICAID | Admitting: Sports Medicine

## 2023-10-21 ENCOUNTER — Encounter: Payer: Self-pay | Admitting: Sports Medicine

## 2023-10-21 ENCOUNTER — Ambulatory Visit: Payer: MEDICAID

## 2023-10-21 DIAGNOSIS — M1712 Unilateral primary osteoarthritis, left knee: Secondary | ICD-10-CM | POA: Diagnosis not present

## 2023-10-21 DIAGNOSIS — M25561 Pain in right knee: Secondary | ICD-10-CM

## 2023-10-21 DIAGNOSIS — M170 Bilateral primary osteoarthritis of knee: Secondary | ICD-10-CM

## 2023-10-21 MED ORDER — ACETAMINOPHEN ER 650 MG PO TBCR: 650.0000 mg | EXTENDED_RELEASE_TABLET | Freq: Three times a day (TID) | ORAL | Status: DC | PRN

## 2023-10-21 NOTE — Assessment & Plan Note (Signed)
 Right medial knee pain, likely OA, unable NSAIDS, adding arthritis tylenol , PT, RTC 6 weeks.

## 2023-10-21 NOTE — Progress Notes (Signed)
    Procedures performed today:    None.  Independent interpretation of notes and tests performed by another provider:   None.  Brief History, Exam, Impression, and Recommendations:    Primary osteoarthritis of both knees Right medial knee pain, likely OA, unable NSAIDS, adding arthritis tylenol , PT, RTC 6 weeks.    ____________________________________________ Joselyn Nicely. Sandy Crumb, M.D., ABFM., CAQSM., AME. Primary Care and Sports Medicine [facility]  Adjunct Professor of [facility] Medicine  [employer]  Restaurant manager, fast food

## 2023-10-22 ENCOUNTER — Ambulatory Visit: Payer: MEDICAID

## 2023-10-22 DIAGNOSIS — Z1231 Encounter for screening mammogram for malignant neoplasm of breast: Secondary | ICD-10-CM

## 2023-10-22 NOTE — Progress Notes (Signed)
 NEUROLOGY FOLLOW UP OFFICE NOTE  Di Erin Nielsen [id number]  Assessment/Plan:   Left thalamocapsular infarct, likely secondary to small vessel disease Migraine without aura, without status migrainosus, not intractable Left hand paresthesias may be side effect of topiramate  (already has paresthesias in the right hand. Hypertension Hyperlipidemia   Migraine prevention:  Increase nortriptyline  to 25mg  at bedtime.  Migraine acute treatment:  Try samples of Ubrelvy 100mg .  Discontinue sumatriptan  as triptans contraindicated with history of stroke.   Limit use of pain relievers to no more than 2 days out of week to prevent risk of rebound or medication-overuse headache. Secondary stroke prevention as managed by PCP: ASA 81mg  daily Atorvastatin  20mg  daily (LDL was already at goal of less than 70) Normotensive blood pressure Hgb A1c goal less than 7 Mediterranean diet Follow up 6 months.   Subjective:  Erin Nielsen is a 68 year old left-handed female with HTN, HLD and GERD who follows up for migraine and stroke.  She is accompanied by Mandarin-speaking interpreter.  UPDATE: Migraines: To see if paresthesias were secondary to topiramate , she was transitioned from topiramate  to nortriptyline .  No longer has numbness in the left hand.  Right side resolved as well.   Only two headaches in the past 6 months but they would occur daily for 3 to 4 days.  PCP prescribed sumatriptan  but she hasn't tried it yet.  Stroke: Stable.  No new symptoms.  Current antidepressant:  nortriptyline   Current NSAID:  ASA 81mg  daily Current triptan:  sumatriptan  50mg  (has not taken yet) Current statin:  atorvastatin  20mg  daily Current antihypertensive:  metoprolol  succinate 50mg   HISTORY: She has history of migraines since her 63s.  They are severe right sided (primarily posterior) pounding headaches with photophobia and phonophobia.  In early October 2024, the headaches began getting worse.  Worse when  sleeping on her right side.  She started feeling lightheaded and foggy-headed.  Blood pressure in the mornings were around 136-144 systolic.  She then endorsed right facial numbness and on a couple of nights, she woke up and felt that the right side of her face had dropped and had slurred speech.  She was advised by her PCP to go to the hospital and she went to [facility] on 04/03/2023.  MRI of brain without contrast revealed mild chronic small vessel ischemic changes in the periventricular white matter and a remote left basal ganglia infarct but no acute or subacute findings.  CTA head and neck revealed no LVO or hemodynamically significant stenosis but a slightly undulating contour of the cervical ICAs seen, either artifactual or may represent fibromuscular dysplasia.  2D echo revealed EF 60-65% with negative bubble study.  LDL was 60 and Hgb A1c was 5.9.  She was started on ASA and atorvastatin  was increased from 20mg  to 80mg  daily.  She was started on topiramate  for headache.  No headaches since starting topiramate .  Still has significant numbness and tingling in the right arm.  Over the past couple of weeks, started getting numbness and tingling in the left hand as well.   Current medications:  ASA 81mg  daily, atorvastatin  80mg  daily, amlodipine , metoprolol  succinate, nortriptyline  10mg   Past medications:  topiramate .  PAST MEDICAL HISTORY: Past Medical History:  Diagnosis Date   Arthritis    Dizziness    Hyperlipidemia     MEDICATIONS: Current Outpatient Medications on File Prior to Visit  Medication Sig Dispense Refill   acetaminophen  (TYLENOL ) 650 MG CR tablet Take 1 tablet (650 mg  total) by mouth every 8 (eight) hours as needed for pain.     amLODipine  (NORVASC ) 5 MG tablet Take 1 tablet (5 mg total) by mouth daily. 90 tablet 3   aspirin  81 MG chewable tablet Chew 1 tablet (81 mg total) by mouth daily. 90 tablet 1   atorvastatin  (LIPITOR) 20 MG tablet  Take 1 tablet (20 mg total) by mouth daily. 30 tablet 5   famotidine  (PEPCID ) 20 MG tablet Take 1 tablet (20 mg total) by mouth at bedtime. 90 tablet 3   lansoprazole  (PREVACID ) 30 MG capsule TAKE ONE CAPSULE BY MOUTH DAILY AT 12 NOON 90 capsule 3   metoprolol  succinate (TOPROL  XL) 50 MG 24 hr tablet Take 1 tablet (50 mg total) by mouth daily. Take with or immediately following a meal. 90 tablet 3   nortriptyline  (PAMELOR ) 10 MG capsule Take 1 capsule (10 mg total) by mouth at bedtime. 30 capsule 5   SUMAtriptan  (IMITREX ) 50 MG tablet Take 0.5-1 tablets (25-50 mg total) by mouth every 2 (two) hours as needed for migraine. May repeat in 2 hours if headache persists or recurs. Maximum dose per day is 100mg . 10 tablet 0   valACYclovir  (VALTREX ) 500 MG tablet Take 1 tablet (500 mg total) by mouth daily. 90 tablet 3   No current facility-administered medications on file prior to visit.    ALLERGIES: Allergies  Allergen Reactions   Ibuprofen      Other reaction(s): Dizziness (intolerance)    FAMILY HISTORY: Family History  Problem Relation Age of Onset   Tongue cancer Brother    Colon cancer Neg Hx       Objective:  Blood pressure 122/67, pulse 66, height 5' 2 (1.575 m), weight 134 lb (60.8 kg), SpO2 97%. General: No acute distress.  Patient appears well-groomed.      Erin Members, DO  CC: [name], NP

## 2023-10-23 ENCOUNTER — Ambulatory Visit (INDEPENDENT_AMBULATORY_CARE_PROVIDER_SITE_OTHER): Payer: MEDICAID | Admitting: Neurology

## 2023-10-23 ENCOUNTER — Encounter: Payer: Self-pay | Admitting: Neurology

## 2023-10-23 VITALS — BP 122/67 | HR 66 | Ht 62.0 in | Wt 134.0 lb

## 2023-10-23 DIAGNOSIS — I639 Cerebral infarction, unspecified: Secondary | ICD-10-CM | POA: Diagnosis not present

## 2023-10-23 DIAGNOSIS — G43009 Migraine without aura, not intractable, without status migrainosus: Secondary | ICD-10-CM | POA: Diagnosis not present

## 2023-10-23 DIAGNOSIS — E785 Hyperlipidemia, unspecified: Secondary | ICD-10-CM | POA: Diagnosis not present

## 2023-10-23 DIAGNOSIS — I10 Essential (primary) hypertension: Secondary | ICD-10-CM | POA: Diagnosis not present

## 2023-10-23 NOTE — Progress Notes (Signed)
 Medication Samples have been provided to the patient.  Drug name: Florette Hurry       Strength: 100 mg        Qty: 4   LOT: [id number]  Exp.Date: 12/26  Dosing instructions: as needed  The patient has been instructed regarding the correct time, dose, and frequency of taking this medication, including desired effects and most common side effects.   Erin Nielsen 9:24 AM 10/23/2023

## 2023-10-23 NOTE — Patient Instructions (Addendum)
 Increase nortriptyline  to 25mg  at bedtime Do not take the sumatriptan  At earliest onset of headache, take Ubrelvy pill.  May repeat after 2 hours.  Maximum 2 tablets in 24 hours.  Contact me to let me know if it works Continue aspirin  81mg  daily, blood pressure medication and cholesterol medication Follow up 6 months.    1. ???????????25?? 2. ???????? 3. ???????????Ubrelvy??2?????????24???????2?????????????? 4. ??????81?????????????????? 5. ??6??? 1. Shu qin ji?ng q ji? t ln jling z?ng zh 25 hok 2. Tngzh? fyng sh? m? q? t?n 3. Tutng ch?q, fyng y?u b wi Florette Hurry).2 Xi?osh hu k? chngf fyng.24 Xi?osh ni zudu? fyng 2 pin. R y?u rnh xiogu?, q?ng linx w?. 4. Jx m?i r fyng 81 hok ?s?p?ln, jing y? yo h jing d?ngchn yow. 5. Suf?ng 6 g yu.

## 2023-10-24 ENCOUNTER — Ambulatory Visit: Payer: Self-pay | Admitting: Medical-Surgical

## 2023-10-27 ENCOUNTER — Ambulatory Visit: Payer: Self-pay | Admitting: Sports Medicine

## 2023-11-01 ENCOUNTER — Encounter: Payer: Self-pay | Admitting: Neurology

## 2023-11-01 ENCOUNTER — Other Ambulatory Visit: Payer: Self-pay | Admitting: Neurology

## 2023-11-03 ENCOUNTER — Other Ambulatory Visit: Payer: Self-pay | Admitting: Neurology

## 2023-11-03 MED ORDER — ATORVASTATIN CALCIUM 20 MG PO TABS
20.0000 mg | ORAL_TABLET | Freq: Every day | ORAL | 0 refills | Status: AC
  Filled 2024-04-03: qty 30, 30d supply, fill #0

## 2023-11-03 MED ORDER — NORTRIPTYLINE HCL 25 MG PO CAPS: 25.0000 mg | ORAL_CAPSULE | Freq: Every day | ORAL | 5 refills | Status: DC

## 2023-11-03 MED ORDER — UBRELVY 100 MG PO TABS
1.0000 | ORAL_TABLET | ORAL | 5 refills | Status: AC | PRN
  Filled 2024-04-03: qty 10, 30d supply, fill #0

## 2023-11-05 ENCOUNTER — Other Ambulatory Visit: Payer: Self-pay

## 2023-11-06 ENCOUNTER — Telehealth: Payer: Self-pay | Admitting: Pharmacist

## 2023-11-06 NOTE — Telephone Encounter (Signed)
 Error

## 2023-11-06 NOTE — Telephone Encounter (Signed)
 Pharmacy Patient Advocate Encounter  Received notification from [facility] that Prior Authorization for [place] has been DENIED.  See denial reason below. No denial letter attached in CMM. Will attach denial letter to Media tab once received.   PA #/Case ID/Reference #: [id number]      These were not questions asked during the original PA.  An appeal has already been submitted and documented in a separate encounter.  Thank you, Devere Pandy, PharmD Clinical Pharmacist    Direct Dial: [phone]

## 2023-11-06 NOTE — Telephone Encounter (Signed)
 Pharmacy Patient Advocate Encounter   Received notification from Patient Pharmacy that prior authorization for Ubrelvy 100MG  tablets is required/requested.   Insurance verification completed.   The patient is insured through [facility] .   Per test claim: PA required; PA submitted to above mentioned insurance via CoverMyMeds Key/confirmation #/EOC [id number] Status is pending

## 2023-11-06 NOTE — Telephone Encounter (Signed)
 Appeal has been submitted for ubrelvy. Will advise when response is received, please be advised that most companies may take 30 days to make a decision. Appeal letter and supporting documentation have been faxed to [phone] on 11/06/2023 @4 :25pm.  Thank you, Devere Pandy, PharmD Clinical Pharmacist  [place]  Direct Dial: [phone]

## 2023-11-10 NOTE — Telephone Encounter (Signed)
 Insurance has approved the appeal for Ubrelvy :    Thank you, Devere Pandy, PharmD Clinical Pharmacist  [place]  Direct Dial: [phone]

## 2023-12-01 ENCOUNTER — Ambulatory Visit (INDEPENDENT_AMBULATORY_CARE_PROVIDER_SITE_OTHER): Payer: MEDICAID | Admitting: Sports Medicine

## 2023-12-01 ENCOUNTER — Encounter: Payer: Self-pay | Admitting: Sports Medicine

## 2023-12-01 DIAGNOSIS — M170 Bilateral primary osteoarthritis of knee: Secondary | ICD-10-CM

## 2023-12-01 NOTE — Assessment & Plan Note (Signed)
 Right medial knee pain, only mild OA on x-rays, added Tylenol , home PT, she returns today for the most part pain-free, only minimal discomfort medial joint line, she will continue her home conditioning and return as needed.  Social determinants of health: Interview conducted today with mandrin interpreter.

## 2023-12-01 NOTE — Progress Notes (Signed)
    Procedures performed today:    None.  Independent interpretation of notes and tests performed by another provider:   None.  Brief History, Exam, Impression, and Recommendations:    Primary osteoarthritis of both knees Right medial knee pain, only mild OA on x-rays, added Tylenol , home PT, she returns today for the most part pain-free, only minimal discomfort medial joint line, she will continue her home conditioning and return as needed.  Social determinants of health: Interview conducted today with mandrin interpreter.    ____________________________________________ Debby PARAS. Curtis, M.D., ABFM., CAQSM., AME. Primary Care and Sports Medicine [facility]  Adjunct Professor of [facility] Medicine  [employer]  Restaurant manager, fast food

## 2024-01-13 ENCOUNTER — Encounter: Payer: Self-pay | Admitting: Sports Medicine

## 2024-03-02 ENCOUNTER — Other Ambulatory Visit: Payer: Self-pay | Admitting: Neurology

## 2024-03-03 ENCOUNTER — Other Ambulatory Visit: Payer: Self-pay | Admitting: Medical-Surgical

## 2024-03-03 ENCOUNTER — Other Ambulatory Visit: Payer: Self-pay | Admitting: Neurology

## 2024-03-09 ENCOUNTER — Encounter: Payer: Self-pay | Admitting: Neurology

## 2024-03-09 MED ORDER — NORTRIPTYLINE HCL 25 MG PO CAPS
25.0000 mg | ORAL_CAPSULE | Freq: Every day | ORAL | 1 refills | Status: AC
  Filled 2024-04-03: qty 90, 90d supply, fill #0

## 2024-03-12 ENCOUNTER — Other Ambulatory Visit: Payer: Self-pay

## 2024-03-12 MED ORDER — VALACYCLOVIR HCL 500 MG PO TABS
500.0000 mg | ORAL_TABLET | Freq: Every day | ORAL | 0 refills | Status: AC
  Filled 2024-04-03: qty 90, 90d supply, fill #0

## 2024-03-16 ENCOUNTER — Other Ambulatory Visit (HOSPITAL_COMMUNITY): Payer: Self-pay

## 2024-03-17 ENCOUNTER — Other Ambulatory Visit (HOSPITAL_COMMUNITY): Payer: Self-pay

## 2024-03-18 ENCOUNTER — Other Ambulatory Visit (HOSPITAL_BASED_OUTPATIENT_CLINIC_OR_DEPARTMENT_OTHER): Payer: Self-pay

## 2024-03-18 ENCOUNTER — Other Ambulatory Visit (HOSPITAL_COMMUNITY): Payer: Self-pay

## 2024-03-20 ENCOUNTER — Other Ambulatory Visit (HOSPITAL_COMMUNITY): Payer: Self-pay

## 2024-03-20 MED ORDER — LISINOPRIL 20 MG PO TABS
20.0000 mg | ORAL_TABLET | Freq: Every day | ORAL | 3 refills | Status: DC
  Filled 2024-03-20: qty 90, 90d supply, fill #0

## 2024-03-20 MED ORDER — ATORVASTATIN CALCIUM 20 MG PO TABS
20.0000 mg | ORAL_TABLET | Freq: Every day | ORAL | 0 refills | Status: AC
  Filled 2024-03-20: qty 30, 30d supply, fill #0

## 2024-03-20 MED ORDER — NORTRIPTYLINE HCL 25 MG PO CAPS
25.0000 mg | ORAL_CAPSULE | Freq: Every day | ORAL | 1 refills | Status: AC
  Filled 2024-03-20 – 2024-06-02 (×2): qty 90, 90d supply, fill #0

## 2024-03-20 MED ORDER — UBRELVY 100 MG PO TABS
100.0000 mg | ORAL_TABLET | ORAL | 5 refills | Status: AC | PRN
  Filled 2024-03-20: qty 10, 5d supply, fill #0

## 2024-03-20 MED ORDER — VALACYCLOVIR HCL 500 MG PO TABS
500.0000 mg | ORAL_TABLET | Freq: Every day | ORAL | 0 refills | Status: AC
  Filled 2024-03-20: qty 30, 30d supply, fill #0
  Filled 2024-05-09: qty 30, 30d supply, fill #1
  Filled 2024-06-16: qty 30, 30d supply, fill #2

## 2024-03-20 MED ORDER — CICLOPIROX 8 % EX SOLN
CUTANEOUS | 5 refills | Status: DC
  Filled 2024-03-20: qty 6.6, 30d supply, fill #0

## 2024-03-20 MED ORDER — AMLODIPINE BESYLATE 5 MG PO TABS
5.0000 mg | ORAL_TABLET | Freq: Every day | ORAL | 3 refills | Status: AC
  Filled 2024-03-20: qty 90, 90d supply, fill #0

## 2024-03-22 ENCOUNTER — Other Ambulatory Visit: Payer: Self-pay

## 2024-03-22 ENCOUNTER — Other Ambulatory Visit (HOSPITAL_COMMUNITY): Payer: Self-pay

## 2024-03-23 ENCOUNTER — Other Ambulatory Visit: Payer: Self-pay

## 2024-03-24 ENCOUNTER — Other Ambulatory Visit (HOSPITAL_COMMUNITY): Payer: Self-pay

## 2024-03-25 ENCOUNTER — Other Ambulatory Visit (HOSPITAL_COMMUNITY): Payer: Self-pay

## 2024-04-02 ENCOUNTER — Other Ambulatory Visit (HOSPITAL_COMMUNITY): Payer: Self-pay

## 2024-04-03 ENCOUNTER — Other Ambulatory Visit (HOSPITAL_COMMUNITY): Payer: Self-pay

## 2024-04-03 MED ORDER — AMLODIPINE BESYLATE 5 MG PO TABS
5.0000 mg | ORAL_TABLET | Freq: Every day | ORAL | 3 refills | Status: AC
  Filled 2024-04-03 – 2024-05-09 (×2): qty 90, 90d supply, fill #0

## 2024-04-03 MED ORDER — NORTRIPTYLINE HCL 25 MG PO CAPS
25.0000 mg | ORAL_CAPSULE | Freq: Every day | ORAL | 2 refills | Status: AC
  Filled 2024-04-03 – 2024-05-09 (×2): qty 30, 30d supply, fill #0
  Filled 2024-06-16: qty 30, 30d supply, fill #1

## 2024-04-05 ENCOUNTER — Other Ambulatory Visit: Payer: Self-pay

## 2024-04-05 ENCOUNTER — Other Ambulatory Visit (HOSPITAL_COMMUNITY): Payer: Self-pay

## 2024-04-06 ENCOUNTER — Other Ambulatory Visit: Payer: Self-pay

## 2024-04-07 ENCOUNTER — Other Ambulatory Visit (HOSPITAL_COMMUNITY): Payer: Self-pay

## 2024-04-09 ENCOUNTER — Other Ambulatory Visit (HOSPITAL_COMMUNITY): Payer: Self-pay

## 2024-04-16 ENCOUNTER — Encounter: Payer: Self-pay | Admitting: Medical-Surgical

## 2024-04-16 ENCOUNTER — Ambulatory Visit: Payer: MEDICAID | Admitting: Medical-Surgical

## 2024-04-16 VITALS — BP 146/82 | HR 72 | Resp 20 | Ht 62.0 in | Wt 134.0 lb

## 2024-04-16 DIAGNOSIS — I10 Essential (primary) hypertension: Secondary | ICD-10-CM | POA: Diagnosis not present

## 2024-04-16 DIAGNOSIS — R7303 Prediabetes: Secondary | ICD-10-CM | POA: Diagnosis not present

## 2024-04-16 DIAGNOSIS — E7800 Pure hypercholesterolemia, unspecified: Secondary | ICD-10-CM

## 2024-04-16 DIAGNOSIS — K219 Gastro-esophageal reflux disease without esophagitis: Secondary | ICD-10-CM | POA: Diagnosis not present

## 2024-04-16 DIAGNOSIS — G43009 Migraine without aura, not intractable, without status migrainosus: Secondary | ICD-10-CM | POA: Diagnosis not present

## 2024-04-16 LAB — POCT GLYCOSYLATED HEMOGLOBIN (HGB A1C): Hemoglobin A1C: 5.7 % — AB (ref 4.0–5.6)

## 2024-04-16 NOTE — Progress Notes (Signed)
 Established Patient Office Visit  Subjective   Patient ID: Erin Nielsen, female    DOB: 06-08-1955  Age: 68 y.o. MRN: [id number]  Chief Complaint  Patient presents with   Migraine   Prediabetes    HPI  Mandarin interpreter [phone] Rosaline assisted with appointment today  68 year old female presents for a 6 month follow up for the following chronic conditions:  Primary Hypertension Patient is currently taking amlodipine  5 mg daily and metoprolol  50 mg daily. Patient is doing well with her current blood pressure medication. Patient does routinely check her BP at home with readings being under 120s/70s. She did not take her bp medication this am. Denies any medication side effects.  Pure Cholesterolemia Patient is currently taking atorvastatin  20 mg daily. She is doing well with her current dosage. Denies having any side effects to her medications. Will plan to recheck lipid panel today.  Prediabetes Patient is currenty managing pre-diabetes with dietary management. Previous A1C was 5.7% in May 2025. Patient A1C 5.7% today.   Migaine Patient is currently taking nortriptyline  25 mg nightly with good relief. Followed by Neuro for migraine management. Next follow up is 06/06/24.  Review of Systems  Constitutional: Negative.   HENT: Negative.    Eyes: Negative.   Respiratory: Negative.    Cardiovascular: Negative.   Gastrointestinal: Negative.   Genitourinary: Negative.   Musculoskeletal: Negative.   Skin: Negative.   Neurological: Negative.   Endo/Heme/Allergies: Negative.   Psychiatric/Behavioral: Negative.        Objective:     BP (!) 145/76 (BP Location: Right Arm, Cuff Size: Normal)   Pulse 67   Resp 20   Ht 5' 2 (1.575 m)   Wt 60.8 kg   SpO2 98%   BMI 24.51 kg/m  BP Readings from Last 3 Encounters:  04/16/24 (!) 145/76  10/23/23 122/67  10/16/23 132/69      Physical Exam Vitals and nursing note reviewed.  Constitutional:      General: She is not in  acute distress.    Appearance: Normal appearance.  Cardiovascular:     Rate and Rhythm: Normal rate and regular rhythm.     Pulses: Normal pulses.     Heart sounds: Normal heart sounds.  Pulmonary:     Effort: Pulmonary effort is normal.     Breath sounds: Normal breath sounds.  Neurological:     General: No focal deficit present.     Mental Status: She is alert and oriented to person, place, and time.  Psychiatric:        Mood and Affect: Mood normal.        Behavior: Behavior normal.        Thought Content: Thought content normal.        Judgment: Judgment normal.      No results found for any visits on 04/16/24.  Last CBC Lab Results  Component Value Date   WBC 6.0 02/21/2023   HGB 13.1 02/21/2023   HCT 40.0 02/21/2023   MCV 91 02/21/2023   MCH 29.6 02/21/2023   RDW 12.1 02/21/2023   PLT 293 02/21/2023   Last metabolic panel Lab Results  Component Value Date   GLUCOSE 80 02/21/2023   NA 139 02/21/2023   K 4.2 02/21/2023   CL 99 02/21/2023   CO2 22 02/21/2023   BUN 10 02/21/2023   CREATININE 0.67 02/21/2023   EGFR 96 02/21/2023   CALCIUM  9.3 02/21/2023   PROT 7.7 02/21/2023   ALBUMIN 4.5  02/21/2023   LABGLOB 3.2 02/21/2023   BILITOT 0.9 02/21/2023   ALKPHOS 66 02/21/2023   AST 40 02/21/2023   ALT 28 02/21/2023   Last lipids Lab Results  Component Value Date   CHOL 134 02/21/2023   HDL 48 02/21/2023   LDLCALC 70 02/21/2023   TRIG 79 02/21/2023   CHOLHDL 2.8 02/21/2023   Last hemoglobin A1c Lab Results  Component Value Date   HGBA1C 5.7 (A) 10/16/2023   HGBA1C 5.7 10/16/2023      The ASCVD Risk score (Arnett DK, et al., 2019) failed to calculate for the following reasons:   Risk score cannot be calculated because patient has a medical history suggesting prior/existing ASCVD    Assessment & Plan:   1. Primary hypertension (Primary) -Continue with current medication regimen  2. Gastroesophageal reflux disease, unspecified whether  esophagitis present -Stable on famotidine  and lansoprazole  as needed for reflux  3. Pure hypercholesterolemia -Continue with atorvastatin  20 mg daily - Lipid Profile  4. Migraine without aura and without status migrainosus, not intractable -Continue with current regimen -Followed closely by Neuro  5. Prediabetes -Continue with dietary management -Previous A1C 5.7% -A1C today 5.7%    Return in about 6 months (around 10/15/2024) for chronic disease follow up.    Derrek JINNY Freund, NP Student

## 2024-04-16 NOTE — Progress Notes (Signed)
 Medical screening examination/treatment was performed by qualified clinical staff member and as supervising provider I was immediately available for consultation/collaboration. I have reviewed documentation and agree with assessment and plan.  Thayer Ohm, DNP, APRN, FNP-BC [facility] and Sports Medicine

## 2024-04-17 LAB — LIPID PANEL
Chol/HDL Ratio: 2.3 ratio (ref 0.0–4.4)
Cholesterol, Total: 124 mg/dL (ref 100–199)
HDL: 55 mg/dL (ref >39)
LDL Chol Calc (NIH): 55 mg/dL (ref 0–99)
Triglycerides: 70 mg/dL (ref 0–149)
VLDL Cholesterol Cal: 14 mg/dL (ref 5–40)

## 2024-04-18 ENCOUNTER — Ambulatory Visit: Payer: Self-pay | Admitting: Medical-Surgical

## 2024-05-09 ENCOUNTER — Other Ambulatory Visit (HOSPITAL_COMMUNITY): Payer: Self-pay

## 2024-05-10 ENCOUNTER — Other Ambulatory Visit (HOSPITAL_COMMUNITY): Payer: Self-pay

## 2024-05-10 ENCOUNTER — Other Ambulatory Visit: Payer: Self-pay

## 2024-05-20 ENCOUNTER — Ambulatory Visit (INDEPENDENT_AMBULATORY_CARE_PROVIDER_SITE_OTHER): Admitting: Medical-Surgical

## 2024-05-20 DIAGNOSIS — Z23 Encounter for immunization: Secondary | ICD-10-CM | POA: Diagnosis not present

## 2024-05-31 NOTE — Progress Notes (Unsigned)
 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    Migraine prevention:  Nortriptyline  25mg  at bedtime.  Migraine acute treatment:  Ubrelvy  100mg .  Limit use of pain relievers to no more than 9 days out of the month to prevent risk of rebound or medication-overuse headache. Keep headache diary Follow up 6 months.   Subjective:  Erin Nielsen is a 69 year old left-handed female with HTN, HLD and GERD and history of left thalamocapsular ischemic stroke who follows up for migraine.  She is accompanied by Mandarin-speaking interpreter.  UPDATE: Increased nortriptyline  which was effective.  Ubrelvy  was effective, aborting the headache in a couple of hours.  Headaches now are occurring every 2 to 3 months  She ran out of nortriptyline  and did have an intractable migraine on 10/28, which did not respond to Ubrelvy  and required ED visit for a migraine cocktail.    Still has numbness in fingertips of right hand.     Current antidepressant:  nortriptyline  25mg  at bedtime Current NSAID:  ASA 81mg  daily Current CGRP inhibitor:  Ubrelvy  100mg  Current statin:  atorvastatin  20mg  daily Current antihypertensive:  metoprolol  succinate 50mg   HISTORY: She has history of migraines since her 108s.  They are severe right sided (primarily posterior) pounding headaches with photophobia and phonophobia.  In early October 2024, the headaches began getting worse.  Worse when sleeping on her right side.  She started feeling lightheaded and foggy-headed.  Blood pressure in the mornings were around 136-144 systolic.  She then endorsed right facial numbness and on a couple of nights, she woke up and felt that the right side of her face had dropped and had slurred speech.  She was advised by her PCP to go to the hospital and she went to [facility] on 04/03/2023.  MRI of brain without contrast revealed mild chronic small vessel ischemic changes in the periventricular white matter and a remote left basal ganglia infarct but no acute or subacute findings.  CTA head and neck revealed no LVO or hemodynamically significant stenosis but a slightly undulating contour of the cervical ICAs seen, either artifactual or may represent fibromuscular dysplasia.  2D echo revealed EF 60-65% with negative bubble study.  LDL was 60 and Hgb A1c was 5.9.  She was started on ASA and atorvastatin  was increased from 20mg  to 80mg  daily.  She was started on topiramate  for headache.  No headaches since starting topiramate .  Still has significant numbness and tingling in the right arm.  Over the past couple of weeks, started getting numbness and tingling in the left hand as well.   Current medications:  ASA 81mg  daily, atorvastatin  80mg  daily, amlodipine , metoprolol  succinate, nortriptyline  10mg   Past medications:  topiramate  (paresthesias).  PAST MEDICAL HISTORY: Past Medical History:  Diagnosis Date   Arthritis    Dizziness    Hyperlipidemia     MEDICATIONS: Current Outpatient Medications on File Prior to Visit  Medication Sig Dispense Refill   amLODipine  (NORVASC ) 5 MG tablet Take 1 tablet (5 mg total) by mouth daily. 90 tablet 3   amLODipine  (NORVASC ) 5 MG tablet Take 1 tablet (5 mg total) by mouth daily. 90 tablet 3   amLODipine  (NORVASC ) 5 MG tablet Take 1 tablet (5 mg total) by mouth daily. 90 tablet 3   aspirin  81 MG chewable tablet Chew 1 tablet (  81 mg total) by mouth daily. 90 tablet 1   atorvastatin  (LIPITOR) 20 MG tablet Take 1 tablet (20 mg total) by mouth daily. 30 tablet 0   atorvastatin  (LIPITOR) 20 MG tablet Take 1 tablet (20 mg total) by mouth daily. 30 tablet 0   famotidine  (PEPCID ) 20 MG tablet Take 1 tablet (20 mg total) by mouth at bedtime. 90 tablet 3   lansoprazole  (PREVACID ) 30 MG capsule TAKE ONE CAPSULE  BY MOUTH DAILY AT 12 NOON 90 capsule 3   metoprolol  succinate (TOPROL -XL) 50 MG 24 hr tablet TAKE 1 TABLET BY MOUTH DAILY TAKE WITH A MEAL OR IMMEDIATELY FOLLOWING A MEAL 90 tablet 0   nortriptyline  (PAMELOR ) 25 MG capsule Take 1 capsule (25 mg total) by mouth at bedtime. 90 capsule 1   nortriptyline  (PAMELOR ) 25 MG capsule Take 1 capsule (25 mg total) by mouth at bedtime. 90 capsule 1   nortriptyline  (PAMELOR ) 25 MG capsule Take 1 capsule (25 mg total) by mouth daily. 30 capsule 2   Ubrogepant  (UBRELVY ) 100 MG TABS Take 1 tablet (100 mg total) by mouth as needed. May repeat after 2 hours.  Maximum 2 tablets in 24 hours. 10 tablet 5   Ubrogepant  (UBRELVY ) 100 MG TABS Take 1 tablet (100 mg total) by mouth as needed for migraine. May repeat after 2 hours. Maximum 2 tablets in 24 hours. 10 tablet 5   valACYclovir  (VALTREX ) 500 MG tablet Take 1 tablet (500 mg total) by mouth daily. 90 tablet 0   valACYclovir  (VALTREX ) 500 MG tablet Take 1 tablet (500 mg total) by mouth daily. 90 tablet 0   [DISCONTINUED] lisinopril  (ZESTRIL ) 20 MG tablet Take 1 tablet (20 mg total) by mouth daily. 90 tablet 3   No current facility-administered medications on file prior to visit.    ALLERGIES: Allergies  Allergen Reactions   Ibuprofen      Other reaction(s): Dizziness (intolerance)    FAMILY HISTORY: Family History  Problem Relation Age of Onset   Tongue cancer Brother    Colon cancer Neg Hx       Objective:  Blood pressure (!) 142/75, pulse 66, height 5' 2 (1.575 m), weight 135 lb 12.8 oz (61.6 kg), SpO2 99%. General: No acute distress.  Patient appears well-groomed.   Head:  Normocephalic/atraumatic      Juliene Dunnings, DO  CC: Zada Palin, NP       "

## 2024-06-01 ENCOUNTER — Encounter: Payer: Self-pay | Admitting: Neurology

## 2024-06-01 ENCOUNTER — Ambulatory Visit: Payer: MEDICAID | Admitting: Neurology

## 2024-06-01 VITALS — BP 142/75 | HR 66 | Ht 62.0 in | Wt 135.8 lb

## 2024-06-01 DIAGNOSIS — G43009 Migraine without aura, not intractable, without status migrainosus: Secondary | ICD-10-CM | POA: Diagnosis not present

## 2024-06-01 NOTE — Patient Instructions (Signed)
 Nortriptyline  25mg  at bedtime Ubrelvy  as needed.

## 2024-06-02 ENCOUNTER — Other Ambulatory Visit (HOSPITAL_COMMUNITY): Payer: Self-pay

## 2024-06-02 ENCOUNTER — Other Ambulatory Visit (HOSPITAL_BASED_OUTPATIENT_CLINIC_OR_DEPARTMENT_OTHER): Payer: Self-pay

## 2024-06-16 ENCOUNTER — Other Ambulatory Visit: Payer: Self-pay

## 2024-06-16 ENCOUNTER — Encounter: Payer: Self-pay | Admitting: Medical-Surgical

## 2024-06-16 ENCOUNTER — Other Ambulatory Visit (HOSPITAL_COMMUNITY): Payer: Self-pay

## 2024-06-16 MED ORDER — METOPROLOL SUCCINATE ER 50 MG PO TB24
50.0000 mg | ORAL_TABLET | Freq: Every day | ORAL | 1 refills | Status: AC
  Filled 2024-06-16: qty 90, 90d supply, fill #0

## 2024-10-15 ENCOUNTER — Ambulatory Visit: Admitting: Medical-Surgical

## 2024-11-29 ENCOUNTER — Ambulatory Visit: Payer: Self-pay | Admitting: Neurology
